# Patient Record
Sex: Female | Born: 1955 | Race: White | Hispanic: No | Marital: Married | Smoking: Former smoker
Health system: Southern US, Community
[De-identification: ages and names within clinical notes are randomized; demographics above are authoritative.]

## PROBLEM LIST (undated history)

## (undated) DIAGNOSIS — T7840XA Allergy, unspecified, initial encounter: Secondary | ICD-10-CM

## (undated) DIAGNOSIS — G43909 Migraine, unspecified, not intractable, without status migrainosus: Secondary | ICD-10-CM

## (undated) DIAGNOSIS — R195 Other fecal abnormalities: Secondary | ICD-10-CM

## (undated) DIAGNOSIS — Z9889 Other specified postprocedural states: Secondary | ICD-10-CM

## (undated) DIAGNOSIS — R112 Nausea with vomiting, unspecified: Secondary | ICD-10-CM

## (undated) DIAGNOSIS — F419 Anxiety disorder, unspecified: Secondary | ICD-10-CM

## (undated) DIAGNOSIS — I10 Essential (primary) hypertension: Secondary | ICD-10-CM

## (undated) DIAGNOSIS — M519 Unspecified thoracic, thoracolumbar and lumbosacral intervertebral disc disorder: Secondary | ICD-10-CM

## (undated) DIAGNOSIS — K5900 Constipation, unspecified: Secondary | ICD-10-CM

## (undated) DIAGNOSIS — M1990 Unspecified osteoarthritis, unspecified site: Secondary | ICD-10-CM

## (undated) DIAGNOSIS — E785 Hyperlipidemia, unspecified: Secondary | ICD-10-CM

## (undated) HISTORY — PX: KNEE CARTILAGE SURGERY: SHX688

## (undated) HISTORY — PX: KNEE SURGERY: SHX244

## (undated) HISTORY — DX: Allergy, unspecified, initial encounter: T78.40XA

## (undated) HISTORY — DX: Essential (primary) hypertension: I10

## (undated) HISTORY — DX: Unspecified thoracic, thoracolumbar and lumbosacral intervertebral disc disorder: M51.9

## (undated) HISTORY — PX: CHOLECYSTECTOMY: SHX55

## (undated) HISTORY — DX: Hyperlipidemia, unspecified: E78.5

## (undated) HISTORY — DX: Unspecified osteoarthritis, unspecified site: M19.90

## (undated) HISTORY — DX: Constipation, unspecified: K59.00

## (undated) HISTORY — DX: Other fecal abnormalities: R19.5

## (undated) HISTORY — DX: Nausea with vomiting, unspecified: Z98.890

## (undated) HISTORY — PX: APPENDECTOMY: SHX54

## (undated) HISTORY — DX: Anxiety disorder, unspecified: F41.9

## (undated) HISTORY — DX: Other specified postprocedural states: R11.2

## (undated) HISTORY — PX: JOINT REPLACEMENT: SHX530

## (undated) HISTORY — DX: Migraine, unspecified, not intractable, without status migrainosus: G43.909

---

## 1977-09-15 HISTORY — PX: ABDOMINAL HYSTERECTOMY: SHX81

## 1999-01-08 ENCOUNTER — Other Ambulatory Visit: Admission: RE | Admit: 1999-01-08 | Discharge: 1999-01-08 | Payer: Self-pay | Admitting: Family Medicine

## 1999-10-29 ENCOUNTER — Emergency Department (HOSPITAL_COMMUNITY): Admission: EM | Admit: 1999-10-29 | Discharge: 1999-10-29 | Payer: Self-pay | Admitting: Emergency Medicine

## 2000-09-13 ENCOUNTER — Emergency Department (HOSPITAL_COMMUNITY): Admission: EM | Admit: 2000-09-13 | Discharge: 2000-09-13 | Payer: Self-pay | Admitting: Emergency Medicine

## 2003-02-08 ENCOUNTER — Emergency Department (HOSPITAL_COMMUNITY): Admission: EM | Admit: 2003-02-08 | Discharge: 2003-02-08 | Payer: Self-pay | Admitting: Emergency Medicine

## 2010-12-02 ENCOUNTER — Emergency Department (HOSPITAL_COMMUNITY): Payer: No Typology Code available for payment source

## 2010-12-02 ENCOUNTER — Emergency Department (HOSPITAL_COMMUNITY)
Admission: EM | Admit: 2010-12-02 | Discharge: 2010-12-02 | Disposition: A | Discharge status: Home / Self Care | Payer: No Typology Code available for payment source | Attending: Emergency Medicine | Admitting: Emergency Medicine

## 2010-12-02 DIAGNOSIS — S8000XA Contusion of unspecified knee, initial encounter: Secondary | ICD-10-CM | POA: Insufficient documentation

## 2010-12-02 DIAGNOSIS — R51 Headache: Secondary | ICD-10-CM | POA: Insufficient documentation

## 2010-12-02 DIAGNOSIS — Y92410 Unspecified street and highway as the place of occurrence of the external cause: Secondary | ICD-10-CM | POA: Insufficient documentation

## 2010-12-02 DIAGNOSIS — IMO0002 Reserved for concepts with insufficient information to code with codable children: Secondary | ICD-10-CM | POA: Insufficient documentation

## 2010-12-02 DIAGNOSIS — S9030XA Contusion of unspecified foot, initial encounter: Secondary | ICD-10-CM | POA: Insufficient documentation

## 2012-03-14 ENCOUNTER — Encounter: Payer: Self-pay | Admitting: Internal Medicine

## 2012-03-14 ENCOUNTER — Ambulatory Visit (INDEPENDENT_AMBULATORY_CARE_PROVIDER_SITE_OTHER): Payer: BLUE CROSS/BLUE SHIELD | Admitting: Internal Medicine

## 2012-03-14 VITALS — BP 126/85 | HR 92 | Temp 97.9°F | Resp 18 | Ht 62.0 in | Wt 140.6 lb

## 2012-03-14 DIAGNOSIS — R1906 Epigastric swelling, mass or lump: Secondary | ICD-10-CM

## 2012-03-14 DIAGNOSIS — R1013 Epigastric pain: Secondary | ICD-10-CM

## 2012-03-14 DIAGNOSIS — R110 Nausea: Secondary | ICD-10-CM

## 2012-03-14 DIAGNOSIS — R0789 Other chest pain: Secondary | ICD-10-CM

## 2012-03-14 LAB — POCT UA - MICROSCOPIC ONLY
Bacteria, U Microscopic: NEGATIVE
Casts, Ur, LPF, POC: NEGATIVE
Crystals, Ur, HPF, POC: NEGATIVE
Mucus, UA: NEGATIVE
RBC, urine, microscopic: NEGATIVE
Yeast, UA: NEGATIVE

## 2012-03-14 LAB — POCT CBC
Granulocyte percent: 78.1 %G (ref 37–80)
HCT, POC: 45.9 % (ref 37.7–47.9)
Hemoglobin: 14.5 g/dL (ref 12.2–16.2)
Lymph, poc: 0.7 (ref 0.6–3.4)
MCH, POC: 30 pg (ref 27–31.2)
MCHC: 31.6 g/dL — AB (ref 31.8–35.4)
MCV: 95 fL (ref 80–97)
MID (cbc): 0.3 (ref 0–0.9)
MPV: 10 fL (ref 0–99.8)
POC Granulocyte: 3.4 (ref 2–6.9)
POC LYMPH PERCENT: 16 %L (ref 10–50)
POC MID %: 5.9 %M (ref 0–12)
Platelet Count, POC: 173 10*3/uL (ref 142–424)
RBC: 4.83 M/uL (ref 4.04–5.48)
RDW, POC: 13.1 %
WBC: 4.3 10*3/uL — AB (ref 4.6–10.2)

## 2012-03-14 LAB — COMPREHENSIVE METABOLIC PANEL
ALT: 25 U/L (ref 0–35)
AST: 27 U/L (ref 0–37)
Albumin: 4.2 g/dL (ref 3.5–5.2)
Alkaline Phosphatase: 75 U/L (ref 39–117)
BUN: 15 mg/dL (ref 6–23)
CO2: 30 mEq/L (ref 19–32)
Calcium: 9.3 mg/dL (ref 8.4–10.5)
Chloride: 102 mEq/L (ref 96–112)
Creat: 0.71 mg/dL (ref 0.50–1.10)
Glucose, Bld: 89 mg/dL (ref 70–99)
Potassium: 5.2 mEq/L (ref 3.5–5.3)
Sodium: 141 mEq/L (ref 135–145)
Total Bilirubin: 0.8 mg/dL (ref 0.3–1.2)
Total Protein: 6.5 g/dL (ref 6.0–8.3)

## 2012-03-14 MED ORDER — ONDANSETRON 8 MG PO TBDP: 8.0000 mg | ORAL_TABLET | Freq: Three times a day (TID) | ORAL | Status: AC | PRN

## 2012-03-14 MED ORDER — SUCRALFATE 1 GM/10ML PO SUSP: 1.0000 g | Freq: Four times a day (QID) | ORAL | Status: DC

## 2012-03-14 MED ORDER — ONDANSETRON 4 MG PO TBDP
8.0000 mg | ORAL_TABLET | Freq: Once | ORAL | Status: AC
  Administered 2012-03-14: 8 mg via ORAL

## 2012-03-14 MED ORDER — PROMETHAZINE HCL 25 MG PO TABS: 25.0000 mg | ORAL_TABLET | Freq: Three times a day (TID) | ORAL | Status: DC | PRN

## 2012-03-14 NOTE — Patient Instructions (Addendum)
Return to clinic if not better or to Emergency room if worseGastritis Gastritis is an inflammation (the body's way of reacting to injury and/or infection) of the stomach. It is often caused by viral or bacterial (germ) infections. It can also be caused by chemicals (including alcohol) and medications. This illness may be associated with generalized malaise (feeling tired, not well), cramps, and fever. The illness may last 2 to 7 days. If symptoms of gastritis continue, gastroscopy (looking into the stomach with a telescope-like instrument), biopsy (taking tissue samples), and/or blood tests may be necessary to determine the cause. Antibiotics will not affect the illness unless there is a bacterial infection present. One common bacterial cause of gastritis is an organism known as H. Pylori. This can be treated with antibiotics. Other forms of gastritis are caused by too much acid in the stomach. They can be treated with medications such as H2 blockers and antacids. Home treatment is usually all that is needed. Young children will quickly become dehydrated (loss of body fluids) if vomiting and diarrhea are both present. Medications may be given to control nausea. Medications are usually not given for diarrhea unless especially bothersome. Some medications slow the removal of the virus from the gastrointestinal tract. This slows down the healing process. HOME CARE INSTRUCTIONS Home care instructions for nausea and vomiting:  For adults: drink small amounts of fluids often. Drink at least 2 quarts a day. Take sips frequently. Do not drink large amounts of fluid at one time. This may worsen the nausea.   Only take over-the-counter or prescription medicines for pain, discomfort, or fever as directed by your caregiver.   Drink clear liquids only. Those are anything you can see through such as water, broth, or soft drinks.   Once you are keeping clear liquids down, you may start full liquids, soups, juices, and  ice cream or sherbet. Slowly add bland (plain, not spicy) foods to your diet.  Home care instructions for diarrhea:  Diarrhea can be caused by bacterial infections or a virus. Your condition should improve with time, rest, fluids, and/or anti-diarrheal medication.   Until your diarrhea is under control, you should drink clear liquids often in small amounts. Clear liquids include: water, broth, jell-o water and weak tea.  Avoid:  Milk.   Fruits.   Tobacco.   Alcohol.   Extremely hot or cold fluids.   Too much intake of anything at one time.  When your diarrhea stops you may add the following foods, which help the stool to become more formed:  Rice.   Bananas.   Apples without skin.   Dry toast.  Once these foods are tolerated you may add low-fat yogurt and low-fat cottage cheese. They will help to restore the normal bacterial balance in your bowel. Wash your hands well to avoid spreading bacteria (germ) or virus. SEEK IMMEDIATE MEDICAL CARE IF:   You are unable to keep fluids down.   Vomiting or diarrhea become persistent (constant).   Abdominal pain develops, increases, or localizes. (Right sided pain can be appendicitis. Left sided pain in adults can be diverticulitis.)   You develop a fever (an oral temperature above 102 F (38.9 C)).   Diarrhea becomes excessive or contains blood or mucus.   You have excessive weakness, dizziness, fainting or extreme thirst.   You are not improving or you are getting worse.   You have any other questions or concerns.  Document Released: 08/26/2001 Document Revised: 08/21/2011 Document Reviewed: 09/01/2005 [employer] Patient Information 2012  [employer].

## 2012-03-14 NOTE — Progress Notes (Signed)
  Subjective:    Patient ID: Angela Graves, female    DOB: 1956/08/18, 56 y.o.   MRN: [id number]  HPI Patient presents with onset of chest tightness with nausea and epigastric pain. Evaluated urgently. EKG and  Labs ordered. Patient reports heavy meal of stroganoff last night awoke this am with symptoms. Pt is s/p cholecystectomy.    Review of Systems     Objective:   Physical Exam        Assessment & Plan:  EKG normal. Patient given 8mg  Zofran for nausea without relief of nausea in office. Discharged with carafate Rx and Phenergan for nausea

## 2012-03-14 NOTE — Progress Notes (Signed)
  Subjective:    Patient ID: Angela Graves, female    DOB: 1956-03-05, 56 y.o.   MRN: [id number]  HPIPresented emergently Awoke with epigastric pain and substernal chest pressure plus nausea 2 hrs ago Not better after eating /no vomiting No GU symptoms/no history of reflux/no fever/is status post cholecystectomy many years ago/ Healthy nonsmoker with no illnesses and no known medications No change in bowel movements    Review of SystemsNo fever chills or night sweats No palpitations, shortness of breath, peripheral edema No constipation or diarrhea No recent symptoms of early a.m. Gastric burning      Objective:   Physical ExamIn mild distress EKG within normal limits HEENT clear Heart regular without murmurs rubs or gallops/rate 65 Lungs clear Tender over the epigastric area to palpation without mass/node enlargement of liver or spleen and both are nontender/no other abdominal masses or tenderness Bowel sounds are quiet   Results for orders placed in visit on 03/14/12  POCT CBC      Component Value Range   WBC 4.3 (*) 4.6 - 10.2 K/uL   Lymph, poc 0.7  0.6 - 3.4   POC LYMPH PERCENT 16.0  10 - 50 %L   MID (cbc) 0.3  0 - 0.9   POC MID % 5.9  0 - 12 %M   POC Granulocyte 3.4  2 - 6.9   Granulocyte percent 78.1  37 - 80 %G   RBC 4.83  4.04 - 5.48 M/uL   Hemoglobin 14.5  12.2 - 16.2 g/dL   HCT, POC 40.9  81.1 - 47.9 %   MCV 95.0  80 - 97 fL   MCH, POC 30.0  27 - 31.2 pg   MCHC 31.6 (*) 31.8 - 35.4 g/dL   RDW, POC 91.4     Platelet Count, POC 173  142 - 424 K/uL   MPV 10.0  0 - 99.8 fL  POCT UA - MICROSCOPIC ONLY      Component Value Range   WBC, Ur, HPF, POC 0-1     RBC, urine, microscopic neg     Bacteria, U Microscopic neg     Mucus, UA neg     Epithelial cells, urine per micros 0-1     Crystals, Ur, HPF, POC neg     Casts, Ur, LPF, POC neg     Yeast, UA neg    Has had no response from Zofran at 30 minutes EKG normal. Patient given 8mg  Zofran for nausea without  relief of nausea in office. Discharged with carafate Rx and Phenergan for nausea Assessment & Plan:  Problem #1 epigastric pain Problem #2 substernal pressure  Differential diagnosis includes an episode of reflux, a foodborne infection, a stone in the common duct Carafate 10 cc 4 times a day for 3 days/Phenergan if needed for nausea/Tylenol if needed for pain Recheck immediately if worse To  68 ER if worse tonight for scanning

## 2012-08-30 ENCOUNTER — Ambulatory Visit: Payer: BLUE CROSS/BLUE SHIELD | Admitting: Family Medicine

## 2012-08-30 VITALS — BP 133/85 | HR 80 | Temp 98.2°F | Resp 17 | Ht 62.5 in | Wt 142.0 lb

## 2012-08-30 DIAGNOSIS — R5383 Other fatigue: Secondary | ICD-10-CM

## 2012-08-30 DIAGNOSIS — R238 Other skin changes: Secondary | ICD-10-CM

## 2012-08-30 DIAGNOSIS — R233 Spontaneous ecchymoses: Secondary | ICD-10-CM

## 2012-08-30 DIAGNOSIS — R5381 Other malaise: Secondary | ICD-10-CM

## 2012-08-30 LAB — VITAMIN B12: Vitamin B-12: 442 pg/mL (ref 211–911)

## 2012-08-30 LAB — POCT CBC
Granulocyte percent: 50.1 %G (ref 37–80)
HCT, POC: 47.8 % (ref 37.7–47.9)
Hemoglobin: 15 g/dL (ref 12.2–16.2)
Lymph, poc: 2 (ref 0.6–3.4)
MCH, POC: 29.6 pg (ref 27–31.2)
MCHC: 31.4 g/dL — AB (ref 31.8–35.4)
MCV: 94.3 fL (ref 80–97)
MID (cbc): 0.3 (ref 0–0.9)
MPV: 11.1 fL (ref 0–99.8)
POC Granulocyte: 2.3 (ref 2–6.9)
POC LYMPH PERCENT: 43.9 %L (ref 10–50)
POC MID %: 6.3 %M (ref 0–12)
Platelet Count, POC: 225 10*3/uL (ref 142–424)
RBC: 5.07 M/uL (ref 4.04–5.48)
RDW, POC: 13.4 %
WBC: 4.6 10*3/uL (ref 4.6–10.2)

## 2012-08-30 LAB — PROTIME-INR
INR: 0.89 (ref <1.50)
Prothrombin Time: 12.1 seconds (ref 11.6–15.2)

## 2012-08-30 LAB — TSH: TSH: 1.769 u[IU]/mL (ref 0.350–4.500)

## 2012-08-30 LAB — COMPREHENSIVE METABOLIC PANEL
ALT: 24 U/L (ref 0–35)
AST: 21 U/L (ref 0–37)
Albumin: 5 g/dL (ref 3.5–5.2)
Alkaline Phosphatase: 84 U/L (ref 39–117)
BUN: 17 mg/dL (ref 6–23)
CO2: 29 mEq/L (ref 19–32)
Calcium: 9.8 mg/dL (ref 8.4–10.5)
Chloride: 100 mEq/L (ref 96–112)
Creat: 0.69 mg/dL (ref 0.50–1.10)
Glucose, Bld: 97 mg/dL (ref 70–99)
Potassium: 4.8 mEq/L (ref 3.5–5.3)
Sodium: 140 mEq/L (ref 135–145)
Total Bilirubin: 0.6 mg/dL (ref 0.3–1.2)
Total Protein: 7.1 g/dL (ref 6.0–8.3)

## 2012-08-30 LAB — POCT SEDIMENTATION RATE: POCT SED RATE: 18 mm/hr (ref 0–22)

## 2012-08-30 LAB — APTT: aPTT: 26 seconds (ref 24–37)

## 2012-08-30 LAB — CK: Total CK: 59 U/L (ref 7–177)
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
## 2012-08-31 LAB — MAGNESIUM: Magnesium: 2.2 mg/dL (ref 1.5–2.5)

## 2012-08-31 LAB — C-REACTIVE PROTEIN: CRP: <0.5 mg/dL (ref <0.60)

## 2012-10-10 ENCOUNTER — Ambulatory Visit (INDEPENDENT_AMBULATORY_CARE_PROVIDER_SITE_OTHER): Payer: BC Managed Care – PPO | Admitting: Emergency Medicine

## 2012-10-10 VITALS — BP 134/82 | HR 81 | Temp 97.9°F | Resp 16 | Ht 62.38 in | Wt 144.6 lb

## 2012-10-10 DIAGNOSIS — H6980 Other specified disorders of Eustachian tube, unspecified ear: Secondary | ICD-10-CM

## 2012-10-10 MED ORDER — MOMETASONE FUROATE 50 MCG/ACT NA SUSP: 2.0000 | Freq: Every day | NASAL | Status: DC

## 2012-10-10 MED ORDER — PSEUDOEPHEDRINE-GUAIFENESIN ER 60-600 MG PO TB12: 1.0000 | ORAL_TABLET | Freq: Two times a day (BID) | ORAL | Status: DC

## 2012-10-10 NOTE — Progress Notes (Signed)
[facility] [address] [place] [address] [phone]  Date:  10/10/2012   Name:  Angela Graves   DOB:  10/16/1955   MRN:  [id number]  PCP:  No primary provider on file.    Chief Complaint: Otalgia   History of Present Illness:  Angela Graves is a 57 y.o. very pleasant female patient who presents with the following:  Sudden onset of pain in left ear.  No antecedent illness. No fever or chills.  No nausea or vomiting or cough. Has some chronic seasonal allergic rhinitis  There is no problem list on file for this patient.   Past Medical History  Diagnosis Date  . Migraine     Past Surgical History  Procedure Date  . Joint replacement   . Cholecystectomy   . Abdominal hysterectomy     History  Substance Use Topics  . Smoking status: Former Games developer  . Smokeless tobacco: Not on file  . Alcohol Use: 3.0 oz/week    5 Glasses of wine per week     Comment: socially    No family history on file.  Allergies  Allergen Reactions  . Codeine Itching  . Morphine And Related Other (See Comments)    Hyperactive    Medication list has been reviewed and updated.  Current Outpatient Prescriptions on File Prior to Visit  Medication Sig Dispense Refill  . promethazine (PHENERGAN) 25 MG tablet Take 1 tablet (25 mg total) by mouth every 8 (eight) hours as needed for nausea.  6 tablet  0  . sucralfate (CARAFATE) 1 GM/10ML suspension Take 10 mLs (1 g total) by mouth 4 (four) times daily.  420 mL  0    Review of Systems:  As per HPI, otherwise negative.    Physical Examination: Filed Vitals:   10/10/12 0853  BP: 134/82  Pulse: 81  Temp: 97.9 F (36.6 C)  Resp: 16   Filed Vitals:   10/10/12 0853  Height: 5' 2.38" (1.584 m)  Weight: 144 lb 9.6 oz (65.59 kg)   Body mass index is 26.13 kg/(m^2). Ideal Body Weight: Weight in (lb) to have BMI = 25: 138.1   GEN: WDWN, NAD, Non-toxic, A & O x 3 HEENT: Atraumatic, Normocephalic. Neck supple.  No masses, No LAD. Ears and Nose: No external deformity.  Left TM retracted CV: RRR, No M/G/R. No JVD. No thrill. No extra heart sounds. PULM: CTA B, no wheezes, crackles, rhonchi. No retractions. No resp. distress. No accessory muscle use. ABD: S, NT, ND, +BS. No rebound. No HSM. EXTR: No c/c/e NEURO Normal gait.  PSYCH: Normally interactive. Conversant. Not depressed or anxious appearing.  Calm demeanor.    Assessment and Plan: Eustachian tube dysfunction mucinex d nasonex Follow up as needed  Carmelina Dane, MD

## 2012-10-10 NOTE — Patient Instructions (Signed)
Barotitis Media Barotitis media is soreness (inflammation) of the area behind the eardrum (middle ear). This occurs when the auditory tube (Eustachian tube) leading from the back of the throat to the eardrum is blocked. When it is blocked air cannot move in and out of the middle ear to equalize pressure changes. These pressure changes come from changes in altitude when:  Flying.  Driving in the mountains.  Diving. Problems are more likely to occur with pressure changes during times when you are congested as from:  Hay fever.  Upper respiratory infection.  A cold. Damage or hearing loss (barotrauma) caused by this may be permanent. HOME CARE INSTRUCTIONS   Use medicines as recommended by your caregiver. Over the counter medicines will help unblock the canal and can help during times of air travel.  Do not put anything into your ears to clean or unplug them. Eardrops will not be helpful.  Do not swim, dive, or fly until your caregiver says it is all right to do so. If these activities are necessary, chewing gum with frequent swallowing may help. It is also helpful to hold your nose and gently blow to pop your ears for equalizing pressure changes. This forces air into the Eustachian tube.  For little ones with problems, give your baby a bottle of water or juice during periods when pressure changes would be anticipated such as during take offs and landings associated with air travel.  Only take over-the-counter or prescription medicines for pain, discomfort, or fever as directed by your caregiver.  A decongestant may be helpful in de-congesting the middle ear and make pressure equalization easier. This can be even more effective if the drops (spray) are delivered with the head lying over the edge of a bed with the head tilted toward the ear on the affected side.  If your caregiver has given you a follow-up appointment, it is very important to keep that appointment. Not keeping the  appointment could result in a chronic or permanent injury, pain, hearing loss and disability. If there is any problem keeping the appointment, you must call back to this facility for assistance. SEEK IMMEDIATE MEDICAL CARE IF:   You develop a severe headache, dizziness, severe ear pain, or bloody or pus-like drainage from your ears.  An oral temperature above 102 F (38.9 C) develops.  Your problems do not improve or become worse. MAKE SURE YOU:   Understand these instructions.  Will watch your condition.  Will get help right away if you are not doing well or get worse. Document Released: 08/29/2000 Document Revised: 11/24/2011 Document Reviewed: 04/06/2008 [facility] Patient Information 2013 [place], [place].

## 2012-10-30 ENCOUNTER — Other Ambulatory Visit: Payer: Self-pay

## 2013-02-11 ENCOUNTER — Ambulatory Visit: Payer: BC Managed Care – PPO | Admitting: Physician Assistant

## 2013-02-11 VITALS — BP 124/86 | HR 97 | Temp 98.1°F | Resp 18 | Ht 62.0 in | Wt 144.0 lb

## 2013-02-11 DIAGNOSIS — J209 Acute bronchitis, unspecified: Secondary | ICD-10-CM

## 2013-02-11 DIAGNOSIS — J069 Acute upper respiratory infection, unspecified: Secondary | ICD-10-CM

## 2013-02-11 MED ORDER — PROMETHAZINE-DM 6.25-15 MG/5ML PO SYRP: 5.0000 mL | ORAL_SOLUTION | Freq: Every evening | ORAL | Status: DC | PRN

## 2013-02-11 MED ORDER — AZITHROMYCIN 250 MG PO TABS: ORAL_TABLET | ORAL | Status: DC

## 2013-02-11 MED ORDER — IPRATROPIUM BROMIDE 0.03 % NA SOLN: 2.0000 | Freq: Two times a day (BID) | NASAL | Status: DC

## 2013-02-11 MED ORDER — BENZONATATE 100 MG PO CAPS: 100.0000 mg | ORAL_CAPSULE | Freq: Three times a day (TID) | ORAL | Status: DC | PRN

## 2013-02-11 NOTE — Patient Instructions (Addendum)
Begin taking the azithromycin as directed.  Be sure to finish the full course.  Tessalon Perles every 8 hours as needed for cough.  Phenergan DM at bedtime for cough.  Plenty of fluids and rest.  Continue Zyrtec daily.  Atrovent nasal spray twice daily to help with nasal congestion and post-nasal drainage.  Please let us know if symptoms are worsening or not improving.   Bronchitis Bronchitis is the body's way of reacting to injury and/or infection (inflammation) of the bronchi. Bronchi are the air tubes that extend from the windpipe into the lungs. If the inflammation becomes severe, it may cause shortness of breath. CAUSES  Inflammation may be caused by:  A virus.  Germs (bacteria).  Dust.  Allergens.  Pollutants and many other irritants. The cells lining the bronchial tree are covered with tiny hairs (cilia). These constantly beat upward, away from the lungs, toward the mouth. This keeps the lungs free of pollutants. When these cells become too irritated and are unable to do their job, mucus begins to develop. This causes the characteristic cough of bronchitis. The cough clears the lungs when the cilia are unable to do their job. Without either of these protective mechanisms, the mucus would settle in the lungs. Then you would develop pneumonia. Smoking is a common cause of bronchitis and can contribute to pneumonia. Stopping this habit is the single most important thing you can do to help yourself. TREATMENT   Your caregiver may prescribe an antibiotic if the cough is caused by bacteria. Also, medicines that open up your airways make it easier to breathe. Your caregiver may also recommend or prescribe an expectorant. It will loosen the mucus to be coughed up. Only take over-the-counter or prescription medicines for pain, discomfort, or fever as directed by your caregiver.  Removing whatever causes the problem (smoking, for example) is critical to preventing the problem from getting  worse.  Cough suppressants may be prescribed for relief of cough symptoms.  Inhaled medicines may be prescribed to help with symptoms now and to help prevent problems from returning.  For those with recurrent (chronic) bronchitis, there may be a need for steroid medicines. SEEK IMMEDIATE MEDICAL CARE IF:   During treatment, you develop more pus-like mucus (purulent sputum).  You have a fever.  Your baby is older than 3 months with a rectal temperature of 102 F (38.9 C) or higher.  Your baby is 64 months old or younger with a rectal temperature of 100.4 F (38 C) or higher.  You become progressively more ill.  You have increased difficulty breathing, wheezing, or shortness of breath. It is necessary to seek immediate medical care if you are elderly or sick from any other disease. MAKE SURE YOU:   Understand these instructions.  Will watch your condition.  Will get help right away if you are not doing well or get worse. Document Released: 09/01/2005 Document Revised: 11/24/2011 Document Reviewed: 07/11/2008 [facility] Patient Information 2014 [place], [place].

## 2013-02-11 NOTE — Progress Notes (Signed)
  Subjective:    Patient ID: Angela Graves, female    DOB: [birth year], 57 y.o.   MRN: [id number]  HPI   Angela Graves is a 57 yr female here with concern for illness.  State she was ill with a cough earlier this month, began to feel better, then the cough has recurred.  Symptoms started 5 days ago.  Cough is minimally productive.  Some nasal congestion and PND as well.  No history of asthma or COPD.  No fever or chills.  No ST or ear pain.  No abd pain or GI symptoms.  Angela Graves has tried Zyrtec and cough drops.  Cough seems to be worse at night, keeps her awake.    Unfortunately, Angela Graves's grandson has run away from home - Angela Graves is very distraught about this.     Review of Systems  Constitutional: Negative for fever and chills.  HENT: Positive for congestion and postnasal drip. Negative for sore throat.   Respiratory: Positive for cough. Negative for shortness of breath and wheezing.   Cardiovascular: Negative.   Gastrointestinal: Negative.   Musculoskeletal: Negative.   Skin: Negative.   Neurological: Negative.        Objective:   Physical Exam  Vitals reviewed. Constitutional: She is oriented to person, place, and time. She appears well-developed and well-nourished. No distress.  HENT:  Head: Normocephalic and atraumatic.  Eyes: Conjunctivae are normal. No scleral icterus.  Neck: Neck supple.  Cardiovascular: Normal rate, regular rhythm and normal heart sounds.   Pulmonary/Chest: Effort normal and breath sounds normal. She has no decreased breath sounds. She has no wheezes. She has no rhonchi. She has no rales.  Lymphadenopathy:    She has no cervical adenopathy.  Neurological: She is alert and oriented to person, place, and time.  Skin: Skin is warm and dry.  Psychiatric: She has a normal mood and affect. Her behavior is normal.          Assessment & Plan:  Acute bronchitis - Plan: azithromycin (ZITHROMAX) 250 MG tablet, promethazine-dextromethorphan (PROMETHAZINE-DM) 6.25-15 MG/5ML  syrup, benzonatate (TESSALON) 100 MG capsule  Acute upper respiratory infections of unspecified site - Plan: ipratropium (ATROVENT) 0.03 % nasal spray   Angela Graves is a 57 yr old female here with cough/URI.  Exam is reassuring, she is afebrile.  Given double-sickening, I think it is reasonable to treat with antibiotics.  Will start Zpak.  Tessalon and Atrovent for symptoms.  Continue Zyrtec daily.  Angela Graves is intolerant to codeine and hydrocodone, so will try phenergan DM for nighttime cough.  Push fluids, rest.  Discussed RTC precautions.

## 2013-07-21 ENCOUNTER — Other Ambulatory Visit: Payer: Self-pay

## 2014-09-08 ENCOUNTER — Emergency Department (HOSPITAL_COMMUNITY)
Admission: EM | Admit: 2014-09-08 | Discharge: 2014-09-08 | Disposition: A | Discharge status: Home / Self Care | Payer: BC Managed Care – PPO | Attending: Emergency Medicine | Admitting: Emergency Medicine

## 2014-09-08 ENCOUNTER — Emergency Department (HOSPITAL_COMMUNITY): Payer: BC Managed Care – PPO

## 2014-09-08 ENCOUNTER — Encounter (HOSPITAL_COMMUNITY): Payer: Self-pay | Admitting: Emergency Medicine

## 2014-09-08 DIAGNOSIS — Z8679 Personal history of other diseases of the circulatory system: Secondary | ICD-10-CM | POA: Insufficient documentation

## 2014-09-08 DIAGNOSIS — R1013 Epigastric pain: Secondary | ICD-10-CM

## 2014-09-08 DIAGNOSIS — Y998 Other external cause status: Secondary | ICD-10-CM | POA: Insufficient documentation

## 2014-09-08 DIAGNOSIS — Z79899 Other long term (current) drug therapy: Secondary | ICD-10-CM | POA: Diagnosis not present

## 2014-09-08 DIAGNOSIS — Y92410 Unspecified street and highway as the place of occurrence of the external cause: Secondary | ICD-10-CM | POA: Insufficient documentation

## 2014-09-08 DIAGNOSIS — S0093XA Contusion of unspecified part of head, initial encounter: Secondary | ICD-10-CM

## 2014-09-08 DIAGNOSIS — R51 Headache: Secondary | ICD-10-CM

## 2014-09-08 DIAGNOSIS — Z87891 Personal history of nicotine dependence: Secondary | ICD-10-CM | POA: Insufficient documentation

## 2014-09-08 DIAGNOSIS — M542 Cervicalgia: Secondary | ICD-10-CM

## 2014-09-08 DIAGNOSIS — M79642 Pain in left hand: Secondary | ICD-10-CM

## 2014-09-08 DIAGNOSIS — S0990XA Unspecified injury of head, initial encounter: Secondary | ICD-10-CM | POA: Diagnosis present

## 2014-09-08 DIAGNOSIS — S0083XA Contusion of other part of head, initial encounter: Secondary | ICD-10-CM | POA: Diagnosis not present

## 2014-09-08 DIAGNOSIS — Y9389 Activity, other specified: Secondary | ICD-10-CM | POA: Diagnosis not present

## 2014-09-08 DIAGNOSIS — S199XXA Unspecified injury of neck, initial encounter: Secondary | ICD-10-CM | POA: Insufficient documentation

## 2014-09-08 DIAGNOSIS — S60222A Contusion of left hand, initial encounter: Secondary | ICD-10-CM | POA: Diagnosis not present

## 2014-09-08 DIAGNOSIS — R519 Headache, unspecified: Secondary | ICD-10-CM

## 2014-09-08 DIAGNOSIS — R110 Nausea: Secondary | ICD-10-CM

## 2014-09-08 MED ORDER — IBUPROFEN 600 MG PO TABS: 600.0000 mg | ORAL_TABLET | Freq: Four times a day (QID) | ORAL | Status: DC | PRN

## 2014-09-08 MED ORDER — HYDROCODONE-ACETAMINOPHEN 5-325 MG PO TABS: 1.0000 | ORAL_TABLET | Freq: Four times a day (QID) | ORAL | Status: DC | PRN

## 2014-09-08 MED ORDER — LORAZEPAM 1 MG PO TABS
1.0000 mg | ORAL_TABLET | Freq: Once | ORAL | Status: AC
  Administered 2014-09-08: 1 mg via ORAL
  Filled 2014-09-08: qty 1

## 2014-09-08 MED ORDER — PROMETHAZINE HCL 25 MG PO TABS: 25.0000 mg | ORAL_TABLET | Freq: Three times a day (TID) | ORAL | Status: DC | PRN
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
## 2015-02-16 ENCOUNTER — Ambulatory Visit (INDEPENDENT_AMBULATORY_CARE_PROVIDER_SITE_OTHER): Payer: BLUE CROSS/BLUE SHIELD | Admitting: Family Medicine

## 2015-02-16 ENCOUNTER — Ambulatory Visit (INDEPENDENT_AMBULATORY_CARE_PROVIDER_SITE_OTHER): Payer: BLUE CROSS/BLUE SHIELD

## 2015-02-16 ENCOUNTER — Encounter: Payer: Self-pay | Admitting: Family Medicine

## 2015-02-16 VITALS — BP 150/92 | HR 86 | Temp 98.1°F | Resp 16 | Ht 62.5 in | Wt 152.0 lb

## 2015-02-16 DIAGNOSIS — M47817 Spondylosis without myelopathy or radiculopathy, lumbosacral region: Secondary | ICD-10-CM | POA: Diagnosis not present

## 2015-02-16 DIAGNOSIS — Z1211 Encounter for screening for malignant neoplasm of colon: Secondary | ICD-10-CM | POA: Diagnosis not present

## 2015-02-16 DIAGNOSIS — Z0000 Encounter for general adult medical examination without abnormal findings: Secondary | ICD-10-CM

## 2015-02-16 DIAGNOSIS — Z1383 Encounter for screening for respiratory disorder NEC: Secondary | ICD-10-CM

## 2015-02-16 DIAGNOSIS — M5137 Other intervertebral disc degeneration, lumbosacral region: Secondary | ICD-10-CM | POA: Diagnosis not present

## 2015-02-16 DIAGNOSIS — Z1239 Encounter for other screening for malignant neoplasm of breast: Secondary | ICD-10-CM

## 2015-02-16 DIAGNOSIS — M545 Low back pain, unspecified: Secondary | ICD-10-CM

## 2015-02-16 DIAGNOSIS — Z136 Encounter for screening for cardiovascular disorders: Secondary | ICD-10-CM | POA: Diagnosis not present

## 2015-02-16 DIAGNOSIS — R0602 Shortness of breath: Secondary | ICD-10-CM

## 2015-02-16 DIAGNOSIS — Z1389 Encounter for screening for other disorder: Secondary | ICD-10-CM | POA: Diagnosis not present

## 2015-02-16 DIAGNOSIS — R072 Precordial pain: Secondary | ICD-10-CM | POA: Diagnosis not present

## 2015-02-16 DIAGNOSIS — R030 Elevated blood-pressure reading, without diagnosis of hypertension: Secondary | ICD-10-CM | POA: Diagnosis not present

## 2015-02-16 DIAGNOSIS — IMO0001 Reserved for inherently not codable concepts without codable children: Secondary | ICD-10-CM

## 2015-02-16 DIAGNOSIS — M2550 Pain in unspecified joint: Secondary | ICD-10-CM

## 2015-02-16 DIAGNOSIS — Z720 Tobacco use: Secondary | ICD-10-CM

## 2015-02-16 LAB — CBC
HCT: 41.5 % (ref 36.0–46.0)
Hemoglobin: 14.4 g/dL (ref 12.0–15.0)
MCH: 30.5 pg (ref 26.0–34.0)
MCHC: 34.7 g/dL (ref 30.0–36.0)
MCV: 87.9 fL (ref 78.0–100.0)
MPV: 11.6 fL (ref 8.6–12.4)
Platelets: 200 10*3/uL (ref 150–400)
RBC: 4.72 MIL/uL (ref 3.87–5.11)
RDW: 12.8 % (ref 11.5–15.5)
WBC: 3.7 10*3/uL — ABNORMAL LOW (ref 4.0–10.5)

## 2015-02-16 LAB — POCT URINALYSIS DIPSTICK
Bilirubin, UA: NEGATIVE
Blood, UA: NEGATIVE
Glucose, UA: NEGATIVE
Ketones, UA: NEGATIVE
Leukocytes, UA: NEGATIVE
Nitrite, UA: NEGATIVE
Protein, UA: NEGATIVE
Spec Grav, UA: 1.015
Urobilinogen, UA: 0.2
pH, UA: 6

## 2015-02-16 MED ORDER — ALBUTEROL SULFATE HFA 108 (90 BASE) MCG/ACT IN AERS: 2.0000 | INHALATION_SPRAY | RESPIRATORY_TRACT | Status: DC | PRN

## 2015-02-16 MED ORDER — DIAZEPAM 5 MG PO TABS
5.0000 mg | ORAL_TABLET | Freq: Two times a day (BID) | ORAL | Status: DC | PRN
Start: 2015-02-16 — End: 2015-06-30
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
## 2015-02-16 NOTE — Patient Instructions (Addendum)
Blood pressure is much to high today but this is likely because it is physical day but please check your blood pressure at your pharmacy or if you (or a friend or family member) have a BP cuff that'd be great. If it is still >140/90, start low salt DASH diet and increase water.  Recommend recheck BP in office in 2-3 months. I recommend GI referral for your symptoms of esophageal spasm as well as for screening colonoscopy. Peppermint oil has been connected to improvement in chest pain and esophageal spasms. The clinical significance of these observations remains to be determined. In experience, high concentration of peppermint oil (eg, sublingual Altoids on an as needed basis) may provide relief for attacks of DES-related chest pain.  Try glucosamine-chondroiton in high doses (>1500 mg) to see if it helps with arthritis pain.  In addition tylenol arthritis is going to be the safest to use.  Try to keep active/exericising/mobile.   Managing Your High Blood Pressure Blood pressure is a measurement of how forceful your blood is pressing against the walls of the arteries. Arteries are muscular tubes within the circulatory system. Blood pressure does not stay the same. Blood pressure rises when you are active, excited, or nervous; and it lowers during sleep and relaxation. If the numbers measuring your blood pressure stay above normal most of the time, you are at risk for health problems. High blood pressure (hypertension) is a long-term (chronic) condition in which blood pressure is elevated. A blood pressure reading is recorded as two numbers, such as 120 over 80 (or 120/80). The first, higher number is called the systolic pressure. It is a measure of the pressure in your arteries as the heart beats. The second, lower number is called the diastolic pressure. It is a measure of the pressure in your arteries as the heart relaxes between beats.  Keeping your blood pressure in a normal range is important to your  overall health and prevention of health problems, such as heart disease and stroke. When your blood pressure is uncontrolled, your heart has to work harder than normal. High blood pressure is a very common condition in adults because blood pressure tends to rise with age. Men and women are equally likely to have hypertension but at different times in life. Before age 69, men are more likely to have hypertension. After 61 years of age, women are more likely to have it. Hypertension is especially common in African Americans. This condition often has no signs or symptoms. The cause of the condition is usually not known. Your caregiver can help you come up with a plan to keep your blood pressure in a normal, healthy range. BLOOD PRESSURE STAGES Blood pressure is classified into four stages: normal, prehypertension, stage 1, and stage 2. Your blood pressure reading will be used to determine what type of treatment, if any, is necessary. Appropriate treatment options are tied to these four stages:  Normal  Systolic pressure (mm Hg): below 120.  Diastolic pressure (mm Hg): below 80. Prehypertension  Systolic pressure (mm Hg): 120 to 139.  Diastolic pressure (mm Hg): 80 to 89. Stage1  Systolic pressure (mm Hg): 140 to 159.  Diastolic pressure (mm Hg): 90 to 99. Stage2  Systolic pressure (mm Hg): 160 or above.  Diastolic pressure (mm Hg): 100 or above. RISKS RELATED TO HIGH BLOOD PRESSURE Managing your blood pressure is an important responsibility. Uncontrolled high blood pressure can lead to:  A heart attack.  A stroke.  A weakened blood vessel (  aneurysm).  Heart failure.  Kidney damage.  Eye damage.  Metabolic syndrome.  Memory and concentration problems. HOW TO MANAGE YOUR BLOOD PRESSURE Blood pressure can be managed effectively with lifestyle changes and medicines (if needed). Your caregiver will help you come up with a plan to bring your blood pressure within a normal range.  Your plan should include the following: Education  Read all information provided by your caregivers about how to control blood pressure.  Educate yourself on the latest guidelines and treatment recommendations. New research is always being done to further define the risks and treatments for high blood pressure. Lifestylechanges  Control your weight.  Avoid smoking.  Stay physically active.  Reduce the amount of salt in your diet.  Reduce stress.  Control any chronic conditions, such as high cholesterol or diabetes.  Reduce your alcohol intake. Medicines  Several medicines (antihypertensive medicines) are available, if needed, to bring blood pressure within a normal range. Communication  Review all the medicines you take with your caregiver because there may be side effects or interactions.  Talk with your caregiver about your diet, exercise habits, and other lifestyle factors that may be contributing to high blood pressure.  See your caregiver regularly. Your caregiver can help you create and adjust your plan for managing high blood pressure. RECOMMENDATIONS FOR TREATMENT AND FOLLOW-UP  The following recommendations are based on current guidelines for managing high blood pressure in nonpregnant adults. Use these recommendations to identify the proper follow-up period or treatment option based on your blood pressure reading. You can discuss these options with your caregiver.  Systolic pressure of 381 to 829 or diastolic pressure of 80 to 89: Follow up with your caregiver as directed.  Systolic pressure of 937 to 169 or diastolic pressure of 90 to 100: Follow up with your caregiver within 2 months.  Systolic pressure above 678 or diastolic pressure above 938: Follow up with your caregiver within 1 month.  Systolic pressure above 101 or diastolic pressure above 751: Consider antihypertensive therapy; follow up with your caregiver within 1 week.  Systolic pressure above 025 or  diastolic pressure above 852: Begin antihypertensive therapy; follow up with your caregiver within 1 week. Document Released: 05/26/2012 Document Reviewed: 05/26/2012 [facility] Patient Information 2015 [place]. This information is not intended to replace advice given to you by your health care provider. Make sure you discuss any questions you have with your health care provider.  Esophageal Spasm Esophageal spasm is an uncoordinated contraction of the muscles of the esophagus (the tube which carries food from your mouth to your stomach). Normally, the muscles of the esophagus alternate between contraction and relaxation starting from the top of the esophagus and working down to the bottom. This moves the food from the mouth to the stomach. In esophageal spasm, all the muscles contract at once. This causes pain and fails to move the food along. As a result, you may have trouble swallowing.  Women are more likely than men to have esophageal spasm. The cause of the spasms is not known. Sometimes eating hot or cold foods triggers the condition and this may be due to an overly sensitive esophagus. This is not an infectious disease and cannot be passed to others. SYMPTOMS  Symptoms of esophageal spasm may include: chest pain, burning or pain with swallowing, and difficulty swallowing.  DIAGNOSIS  Esophageal spasm can be diagnosed by a test called manometry (pressure studies of the esophagus). In this test, a special tube is inserted down the  esophagus. The tube measures the muscle activity of the esophagus. Abnormal contractions mixed with normal movement helps confirm the diagnosis.  A person with a hypersensitive esophagus may be diagnosed by inflating a long balloon in the person's esophagus. If this causes the same symptoms, preventive methods may work. PREVENTION  Avoid hot or cold foods if that seems to be a trigger. PROGNOSIS  This condition does not go away, nor is treatment entirely  satisfactory. Patients need to be careful of what they eat. They need to continue on medication if a useful one is found. Fortunately, the condition does not get progressively worse as time passes. Esophageal spasm does not usually lead to more serious problems but sometimes the pain can be disabling. If a person becomes afraid to eat they may become malnourished and lose weight.  TREATMENT   A procedure in which instruments of increasing size are inserted through the esophagus to enlarge (dilate) it are used.  Medications that decrease acid-production of the stomach may be used such as proton-pump inhibitors or H2-blockers.  Medications of several types can be used to relax the muscles of the esophagus.  An individual with a hypersensitive esophagus sometimes improves with low doses of medications normally used for depression.  No treatment for esophageal spasm is effective for everyone. Often several approaches will be tried before one works. In many cases, the symptoms will improve, but will not go away completely.  For severe cases, relief is obtained two-thirds of the time by cutting the muscles along the entire length of the esophagus. This is a major surgical procedure.  Your symptoms are usually the best guide to how well the treatment for esophageal spasm works. SIDE EFFECTS OF TREATMENTS  Nitrates can cause headaches and low blood pressure.  Calcium channel blockers can cause:  Feeling sick to your stomach (nausea).  Constipation and other side effects.  Antidepressants can cause side effects that depend on the medication used. HOME CARE INSTRUCTIONS   Let your caregiver know if problems are getting worse, or if you get food stuck in your esophagus for longer than 1 hour or as directed and are unable to swallow liquid.  Take medications as directed and with permission of your caregiver. Ask about what to do if a medication seems to get stuck in your esophagus. Only take  over-the-counter or prescription medicines for pain, discomfort, or fever as directed by your caregiver.  Soft and liquid foods pass more easily than solid pieces. SEEK IMMEDIATE MEDICAL CARE IF:   You develop severe chest pain, especially if the pain is crushing or pressure-like and spreads to the arms, back, neck, or jaw, or if you have sweating, nausea, or shortness of breath. THIS COULD BE AN EMERGENCY. Do not wait to see if the pain will go away. Get medical help at once. Call 911 or 0 (operator). DO NOT drive yourself to the hospital.  Your chest pain gets worse and does not go away with rest.  You have an attack of chest pain lasting longer than usual despite rest and treatment with the medications your physician has prescribed.  You wake from sleep with chest pain or shortness of breath.  You feel dizzy or faint.  You have chest pain, not typical of your usual pain, caused by your esophagus for which you originally saw your caregiver. MAKE SURE YOU:   Understand these instructions.  Will watch your condition.  Will get help right away if you are not doing well or get  worse. Document Released: 11/22/2002 Document Revised: 11/24/2011 Document Reviewed: 11/25/2013 [facility] Patient Information 2015 [place], [place]. This information is not intended to replace advice given to you by your health care provider. Make sure you discuss any questions you have with your health care provider.  Health Recommendations for Postmenopausal Women Respected and ongoing research has looked at the most common causes of death, disability, and poor quality of life in postmenopausal women. The causes include heart disease, diseases of blood vessels, diabetes, depression, cancer, and bone loss (osteoporosis). Many things can be done to help lower the chances of developing these and other common problems. CARDIOVASCULAR DISEASE Heart Disease: A heart attack is a medical emergency. Know the signs and  symptoms of a heart attack. Below are things women can do to reduce their risk for heart disease.   Do not smoke. If you smoke, quit.  Aim for a healthy weight. Being overweight causes many preventable deaths. Eat a healthy and balanced diet and drink an adequate amount of liquids.  Get moving. Make a commitment to be more physically active. Aim for 30 minutes of activity on most, if not all days of the week.  Eat for heart health. Choose a diet that is low in saturated fat and cholesterol and eliminate trans fat. Include whole grains, vegetables, and fruits. Read and understand the labels on food containers before buying.  Know your numbers. Ask your caregiver to check your blood pressure, cholesterol (total, HDL, LDL, triglycerides) and blood glucose. Work with your caregiver on improving your entire clinical picture.  High blood pressure. Limit or stop your table salt intake (try salt substitute and food seasonings). Avoid salty foods and drinks. Read labels on food containers before buying. Eating well and exercising can help control high blood pressure. STROKE  Stroke is a medical emergency. Stroke may be the result of a blood clot in a blood vessel in the brain or by a brain hemorrhage (bleeding). Know the signs and symptoms of a stroke. To lower the risk of developing a stroke:  Avoid fatty foods.  Quit smoking.  Control your diabetes, blood pressure, and irregular heart rate. THROMBOPHLEBITIS (BLOOD CLOT) OF THE LEG  Becoming overweight and leading a stationary lifestyle may also contribute to developing blood clots. Controlling your diet and exercising will help lower the risk of developing blood clots. CANCER SCREENING  Breast Cancer: Take steps to reduce your risk of breast cancer.  You should practice "breast self-awareness." This means understanding the normal appearance and feel of your breasts and should include breast self-examination. Any changes detected, no matter how  small, should be reported to your caregiver.  After age 23, you should have a clinical breast exam (CBE) every year.  Starting at age 45, you should consider having a mammogram (breast X-ray) every year.  If you have a family history of breast cancer, talk to your caregiver about genetic screening.  If you are at high risk for breast cancer, talk to your caregiver about having an MRI and a mammogram every year.  Intestinal or Stomach Cancer: Tests to consider are a rectal exam, fecal occult blood, sigmoidoscopy, and colonoscopy. Women who are high risk may need to be screened at an earlier age and more often.  Cervical Cancer:  Beginning at age 31, you should have a Pap test every 3 years as long as the past 3 Pap tests have been normal.  If you have had past treatment for cervical cancer or a condition that could lead  to cancer, you need Pap tests and screening for cancer for at least 20 years after your treatment.  If you had a hysterectomy for a problem that was not cancer or a condition that could lead to cancer, then you no longer need Pap tests.  If you are between ages 34 and 3, and you have had normal Pap tests going back 10 years, you no longer need Pap tests.  If Pap tests have been discontinued, risk factors (such as a new sexual partner) need to be reassessed to determine if screening should be resumed.  Some medical problems can increase the chance of getting cervical cancer. In these cases, your caregiver may recommend more frequent screening and Pap tests.  Uterine Cancer: If you have vaginal bleeding after reaching menopause, you should notify your caregiver.  Ovarian Cancer: Other than yearly pelvic exams, there are no reliable tests available to screen for ovarian cancer at this time except for yearly pelvic exams.  Lung Cancer: Yearly chest X-rays can detect lung cancer and should be done on high risk women, such as cigarette smokers and women with chronic lung  disease (emphysema).  Skin Cancer: A complete body skin exam should be done at your yearly examination. Avoid overexposure to the sun and ultraviolet light lamps. Use a strong sun block cream when in the sun. All of these things are important for lowering the risk of skin cancer. MENOPAUSE Menopause Symptoms: Hormone therapy products are effective for treating symptoms associated with menopause:  Moderate to severe hot flashes.  Night sweats.  Mood swings.  Headaches.  Tiredness.  Loss of sex drive.  Insomnia.  Other symptoms. Hormone replacement carries certain risks, especially in older women. Women who use or are thinking about using estrogen or estrogen with progestin treatments should discuss that with their caregiver. Your caregiver will help you understand the benefits and risks. The ideal dose of hormone replacement therapy is not known. The Food and Drug Administration (FDA) has concluded that hormone therapy should be used only at the lowest doses and for the shortest amount of time to reach treatment goals.  OSTEOPOROSIS Protecting Against Bone Loss and Preventing Fracture If you use hormone therapy for prevention of bone loss (osteoporosis), the risks for bone loss must outweigh the risk of the therapy. Ask your caregiver about other medications known to be safe and effective for preventing bone loss and fractures. To guard against bone loss or fractures, the following is recommended:  If you are younger than age 72, take 1000 mg of calcium and at least 600 mg of Vitamin D per day.  If you are older than age 76 but younger than age 64, take 1200 mg of calcium and at least 600 mg of Vitamin D per day.  If you are older than age 47, take 1200 mg of calcium and at least 800 mg of Vitamin D per day. Smoking and excessive alcohol intake increases the risk of osteoporosis. Eat foods rich in calcium and vitamin D and do weight bearing exercises several times a week as your  caregiver suggests. DIABETES Diabetes Mellitus: If you have type I or type 2 diabetes, you should keep your blood sugar under control with diet, exercise, and recommended medication. Avoid starchy and fatty foods, and too many sweets. Being overweight can make diabetes control more difficult. COGNITION AND MEMORY Cognition and Memory: Menopausal hormone therapy is not recommended for the prevention of cognitive disorders such as Alzheimer's disease or memory loss.  DEPRESSION  Depression may occur at any age, but it is common in elderly women. This may be because of physical, medical, social (loneliness), or financial problems and needs. If you are experiencing depression because of medical problems and control of symptoms, talk to your caregiver about this. Physical activity and exercise may help with mood and sleep. Community and volunteer involvement may improve your sense of value and worth. If you have depression and you feel that the problem is getting worse or becoming severe, talk to your caregiver about which treatment options are best for you. ACCIDENTS  Accidents are common and can be serious in elderly woman. Prepare your house to prevent accidents. Eliminate throw rugs, place hand bars in bath, shower, and toilet areas. Avoid wearing high heeled shoes or walking on wet, snowy, and icy areas. Limit or stop driving if you have vision or hearing problems, or if you feel you are unsteady with your movements and reflexes. HEPATITIS C Hepatitis C is a type of viral infection affecting the liver. It is spread mainly through contact with blood from an infected person. It can be treated, but if left untreated, it can lead to severe liver damage over the years. Many people who are infected do not know that the virus is in their blood. If you are a "baby-boomer", it is recommended that you have one screening test for Hepatitis C. IMMUNIZATIONS  Several immunizations are important to consider having  during your senior years, including:   Tetanus, diphtheria, and pertussis booster shot.  Influenza every year before the flu season begins.  Pneumonia vaccine.  Shingles vaccine.  Others, as indicated based on your specific needs. Talk to your caregiver about these. Document Released: 10/24/2005 Document Revised: 01/16/2014 Document Reviewed: 06/19/2008 [facility] Patient Information 2015 [place], [place]. This information is not intended to replace advice given to you by your health care provider. Make sure you discuss any questions you have with your health care provider. Bone Health Our bones do many things. They provide structure, protect organs, anchor muscles, and store calcium. Adequate calcium in your diet and weight-bearing physical activity help build strong bones, improve bone amounts, and may reduce the risk of weakening of bones (osteoporosis) later in life. PEAK BONE MASS By age 58, the average woman has acquired most of her skeletal bone mass. A large decline occurs in older adults which increases the risk of osteoporosis. In women this occurs around the time of menopause. It is important for young girls to reach their peak bone mass in order to maintain bone health throughout life. A person with high bone mass as a young adult will be more likely to have a higher bone mass later in life. Not enough calcium consumption and physical activity early on could result in a failure to achieve optimum bone mass in adulthood. OSTEOPOROSIS Osteoporosis is a disease of the bones. It is defined as low bone mass with deterioration of bone structure. Osteoporosis leads to an increase risk of fractures with falls. These fractures commonly happen in the wrist, hip, and spine. While men and women of all ages and background can develop osteoporosis, some of the risk factors for osteoporosis are:  Female.  White.  Postmenopausal.  Older adults.  Small in body size.  Eating a diet low in  calcium.  Physically inactive.  Smoking.  Use of some medications.  Family history. CALCIUM Calcium is a mineral needed by the body for healthy bones, teeth, and proper function of the heart, muscles, and nerves.  The body cannot produce calcium so it must be absorbed through food. Good sources of calcium include:  Dairy products (low fat or nonfat milk, cheese, and yogurt).  Dark green leafy vegetables (bok choy and broccoli).  Calcium fortified foods (orange juice, cereal, bread, soy beverages, and tofu products).  Nuts (almonds). Recommended amounts of calcium vary for individuals. RECOMMENDED CALCIUM INTAKES Age and Amount in mg per day  Children 1 to 3 years / 700 mg  Children 4 to 8 years / 1,000 mg  Children 9 to 13 years / 1,300 mg  Teens 14 to 18 years / 1,300 mg  Adults 19 to 50 years / 1,000 mg  Adult women 51 to 70 years / 1,200 mg  Adults 71 years and older / 1,200 mg  Pregnant and breastfeeding teens / 1,300 mg  Pregnant and breastfeeding adults / 1,000 mg Vitamin D also plays an important role in healthy bone development. Vitamin D helps in the absorption of calcium. WEIGHT-BEARING PHYSICAL ACTIVITY Regular physical activity has many positive health benefits. Benefits include strong bones. Weight-bearing physical activity early in life is important in reaching peak bone mass. Weight-bearing physical activities cause muscles and bones to work against gravity. Some examples of weight bearing physical activities include:  Walking, jogging, or running.  [employer].  Jumping rope.  Dancing.  Soccer.  Tennis or Racquetball.  Stair climbing.  Basketball.  Hiking.  Weight lifting.  Aerobic fitness classes. Including weight-bearing physical activity into an exercise plan is a great way to keep bones healthy. Adults: Engage in at least 30 minutes of moderate physical activity on most, preferably all, days of the week. Children: Engage in at  least 60 minutes of moderate physical activity on most, preferably all, days of the week. FOR MORE INFORMATION [place] Web designer, Soil scientist for [employer] and Promotion: www.cnpp.usda.[place]: [employer].ee Document Released: 11/22/2003 Document Revised: 12/27/2012 Document Reviewed: 02/21/2009 [facility] Patient Information 2015 [place], [place]. This information is not intended to replace advice given to you by your health care provider. Make sure you discuss any questions you have with your health care provider. Introduction to Bowel Health Diet and daily habits can help you predict when your bowels will move on a regular basis.  The consistency and quantity of the stool is usually more important than the frequency.  The goal is to have a regular bowel movement that is soft but formed.   Tips on Emptying Regularly . Eat breakfast.  Usually the best time of day for a bowel movement will be a half hour to an hour after eating.  These times are best because the body uses the gastrocolic reflex, a stimulation of bowel motion that occurs with eating, to help produce a bowel movement.  For some people even a simple hot drink in the morning can help the reflex action begin. . Eat all your meals at a predictable time each day.  The bowel functions best when food is introduced at the same regular intervals. . The amount of food eaten at a given time of day should be about the same size from day to day.  The bowel functions best when food is introduced in similar quantities from day to day. It is fine to have a small breakfast and a large lunch, or vice versa, just be consistent. . Eat two servings of fruit or vegetables and at least one serving of a complex carbohydrates (whole grains such as brown rice, bran, whole wheat  bread, or oatmeal) at each meal. . Drink plenty of water-ideally eight glasses a day.  Be sure to increase your water intake if you are  increasing fiber into your diet.  Maintain Healthy Habits . Exercise daily.  You may exercise at any time of day, but you may find that bowel function is helped most if the exercise is at a consistent time each day. . Make sure that you are not rushed and have convenient access to a bathroom at your selected time to empty your bowels.    2007, [employer] Doc.14

## 2015-02-17 LAB — VITAMIN D 25 HYDROXY (VIT D DEFICIENCY, FRACTURES): Vit D, 25-Hydroxy: 29 ng/mL — ABNORMAL LOW (ref 30–100)

## 2015-02-17 LAB — COMPREHENSIVE METABOLIC PANEL
ALT: 57 U/L — ABNORMAL HIGH (ref 0–35)
AST: 36 U/L (ref 0–37)
Albumin: 4.5 g/dL (ref 3.5–5.2)
Alkaline Phosphatase: 78 U/L (ref 39–117)
BUN: 22 mg/dL (ref 6–23)
CO2: 28 mEq/L (ref 19–32)
Calcium: 9.1 mg/dL (ref 8.4–10.5)
Chloride: 100 mEq/L (ref 96–112)
Creat: 0.75 mg/dL (ref 0.50–1.10)
Glucose, Bld: 95 mg/dL (ref 70–99)
Potassium: 4.4 mEq/L (ref 3.5–5.3)
Sodium: 136 mEq/L (ref 135–145)
Total Bilirubin: 0.7 mg/dL (ref 0.2–1.2)
Total Protein: 6.6 g/dL (ref 6.0–8.3)

## 2015-02-17 LAB — LIPID PANEL
Cholesterol: 222 mg/dL — ABNORMAL HIGH (ref 0–200)
HDL: 43 mg/dL — ABNORMAL LOW (ref >=46)
LDL Cholesterol: 138 mg/dL — ABNORMAL HIGH (ref 0–99)
Total CHOL/HDL Ratio: 5.2 Ratio
Triglycerides: 207 mg/dL — ABNORMAL HIGH (ref <150)
VLDL: 41 mg/dL — ABNORMAL HIGH (ref 0–40)

## 2015-02-17 LAB — TSH: TSH: 1.479 u[IU]/mL (ref 0.350–4.500)

## 2015-02-17 LAB — SEDIMENTATION RATE: Sed Rate: 9 mm/hr (ref 0–30)

## 2015-02-17 LAB — C-REACTIVE PROTEIN: CRP: 0.5 mg/dL (ref <0.60)

## 2015-02-20 ENCOUNTER — Encounter: Payer: Self-pay | Admitting: Family Medicine

## 2015-02-20 DIAGNOSIS — M5136 Other intervertebral disc degeneration, lumbar region: Secondary | ICD-10-CM

## 2015-03-01 ENCOUNTER — Encounter: Payer: Self-pay | Admitting: Family Medicine

## 2015-03-01 NOTE — Telephone Encounter (Signed)
Referred pt to [facility] for her low back per her request - please have pt come by to pick up a copy of her cd of her lumbar xray to bring to her appt w/ her - also, please  Burn the cd.

## 2015-03-20 ENCOUNTER — Encounter: Payer: Self-pay | Admitting: Family Medicine

## 2015-03-21 ENCOUNTER — Other Ambulatory Visit: Payer: Self-pay

## 2015-03-21 DIAGNOSIS — Z1231 Encounter for screening mammogram for malignant neoplasm of breast: Secondary | ICD-10-CM

## 2015-03-21 LAB — IFOBT (OCCULT BLOOD): IFOBT: NEGATIVE

## 2015-03-21 NOTE — Addendum Note (Signed)
Addended by: Tommas Olp B on: 03/21/2015 02:01 PM   Modules accepted: Orders

## 2015-03-22 ENCOUNTER — Telehealth: Payer: Self-pay

## 2015-03-22 ENCOUNTER — Telehealth: Payer: Self-pay | Admitting: Acute Care

## 2015-03-22 NOTE — Telephone Encounter (Signed)
Eric Form, NP called concerning pt's order for CT Chest CA Screening. It will need to be done at [facility] and Judson Roch is calling them to make sure they see the order in [facility] and call pt to schedule. She will also contact pt to advise her that [facility] will be calling re: appt. The only thing our Referral Dept needs to do is take care of getting pre-cert for procedure CPT code: G0297, and Dx code: Z87.891. Pt is willing to pay OOP if need be but will need to see if it is covered by ins to know if she needs to have the money to pay at appt.

## 2015-03-22 NOTE — Telephone Encounter (Signed)
I returned a call to this patient who called to schedule a lung cancer screening. She had been referred to [place] by Dr. [name]. Upon obtaining a smoking history it was determined that due to the fact this patient had quit smoking >20 years ago she did not qualify for the program. Despite this she is still interested in having the screening CT done due to her history( which included smoking 3 ppd , but she did not disclose the number of years she smoked.) She understands that it is a $299.00 scan, but she is going to try to get it pre-certified by her insurance through Dr. Raul Del office. I have called Dr. Raul Del office and let them know that the patient did not qualify for the program, but still wants the scan done. I have also called [facility] to make them aware there is an order for the scan and that it needs to be scheduled. They informed me that the order in Epic needs to be faxed to them as they do not work off any type of a que. I have called Dr. Raul Del office, and have spoken with Pamala Hurry about this patient. She is aware the order needs to be faxed to [facility] for them to schedule the scan for the patient. Both the patient and Dr. Raul Del office have my contact information in the event they have any further questions or concerns.

## 2015-03-22 NOTE — Telephone Encounter (Signed)
Angela Graves, this is the order I spoke to you about. The NP called me back and advised (after talking to [facility]) that [facility] does not work out of the queue. The order has to be faxed to them. Thanks!

## 2015-03-26 ENCOUNTER — Encounter: Payer: Self-pay | Admitting: Family Medicine

## 2015-03-28 ENCOUNTER — Inpatient Hospital Stay: Admission: RE | Admit: 2015-03-28 | Payer: BLUE CROSS/BLUE SHIELD | Source: Ambulatory Visit

## 2015-04-11 ENCOUNTER — Other Ambulatory Visit: Payer: Self-pay

## 2015-04-11 ENCOUNTER — Inpatient Hospital Stay: Admission: RE | Admit: 2015-04-11 | Payer: BLUE CROSS/BLUE SHIELD | Source: Ambulatory Visit

## 2015-04-11 DIAGNOSIS — Z87891 Personal history of nicotine dependence: Secondary | ICD-10-CM

## 2015-04-23 ENCOUNTER — Ambulatory Visit
Admission: RE | Admit: 2015-04-23 | Discharge: 2015-04-23 | Disposition: A | Discharge status: Home / Self Care | Payer: BLUE CROSS/BLUE SHIELD | Source: Ambulatory Visit

## 2015-04-23 DIAGNOSIS — Z1231 Encounter for screening mammogram for malignant neoplasm of breast: Secondary | ICD-10-CM

## 2015-04-25 ENCOUNTER — Other Ambulatory Visit: Payer: BLUE CROSS/BLUE SHIELD

## 2015-06-30 ENCOUNTER — Other Ambulatory Visit: Payer: Self-pay | Admitting: Family Medicine

## 2015-07-05 NOTE — Telephone Encounter (Signed)
Rx faxed

## 2015-07-11 ENCOUNTER — Other Ambulatory Visit: Payer: Self-pay | Admitting: Family Medicine

## 2015-07-11 ENCOUNTER — Ambulatory Visit (INDEPENDENT_AMBULATORY_CARE_PROVIDER_SITE_OTHER): Payer: BLUE CROSS/BLUE SHIELD | Admitting: Family Medicine

## 2015-07-11 VITALS — BP 124/80 | HR 68 | Temp 97.6°F | Resp 18 | Ht 62.0 in | Wt 146.0 lb

## 2015-07-11 DIAGNOSIS — R110 Nausea: Secondary | ICD-10-CM | POA: Diagnosis not present

## 2015-07-11 DIAGNOSIS — R7401 Elevation of levels of liver transaminase levels: Secondary | ICD-10-CM

## 2015-07-11 DIAGNOSIS — R1013 Epigastric pain: Secondary | ICD-10-CM

## 2015-07-11 DIAGNOSIS — E559 Vitamin D deficiency, unspecified: Secondary | ICD-10-CM

## 2015-07-11 DIAGNOSIS — E7800 Pure hypercholesterolemia, unspecified: Secondary | ICD-10-CM | POA: Diagnosis not present

## 2015-07-11 DIAGNOSIS — R740 Nonspecific elevation of levels of transaminase and lactic acid dehydrogenase [LDH]: Secondary | ICD-10-CM

## 2015-07-11 LAB — COMPREHENSIVE METABOLIC PANEL
ALT: 60 U/L — ABNORMAL HIGH (ref 6–29)
AST: 37 U/L — ABNORMAL HIGH (ref 10–35)
Albumin: 4.8 g/dL (ref 3.6–5.1)
Alkaline Phosphatase: 104 U/L (ref 33–130)
BUN: 15 mg/dL (ref 7–25)
CO2: 28 mmol/L (ref 20–31)
Calcium: 9.6 mg/dL (ref 8.6–10.4)
Chloride: 99 mmol/L (ref 98–110)
Creat: 0.77 mg/dL (ref 0.50–0.99)
Glucose, Bld: 91 mg/dL (ref 65–99)
Potassium: 4.5 mmol/L (ref 3.5–5.3)
Sodium: 138 mmol/L (ref 135–146)
Total Bilirubin: 0.9 mg/dL (ref 0.2–1.2)
Total Protein: 7.5 g/dL (ref 6.1–8.1)

## 2015-07-11 LAB — LIPID PANEL
Cholesterol: 265 mg/dL — ABNORMAL HIGH (ref 125–200)
HDL: 47 mg/dL (ref >=46)
LDL Cholesterol: 169 mg/dL — ABNORMAL HIGH (ref <130)
Total CHOL/HDL Ratio: 5.6 Ratio — ABNORMAL HIGH (ref <=5.0)
Triglycerides: 247 mg/dL — ABNORMAL HIGH (ref <150)
VLDL: 49 mg/dL — ABNORMAL HIGH (ref <30)

## 2015-07-11 MED ORDER — PROMETHAZINE HCL 25 MG PO TABS: 25.0000 mg | ORAL_TABLET | Freq: Three times a day (TID) | ORAL | Status: DC | PRN

## 2015-07-11 MED ORDER — DIAZEPAM 5 MG PO TABS: 5.0000 mg | ORAL_TABLET | Freq: Two times a day (BID) | ORAL | Status: DC | PRN

## 2015-07-11 NOTE — Patient Instructions (Signed)
Reatha - YOU ARE DOING AMAZING - I AM SO IMPRESSED AND TRULY BLOWN AWAY - IT JUST GOES TO SHOW WHAT A WOMAN CAN DO IF SHE PUTS HER MIND TO IT - I'M ESP EXCITED ABOUT YOUR BACK PAIN BEING RELIEVED - IF YOU HAVE TIME TO SEND ME AN EMAIL WITH RECIPE TO YOUR AMAZING SMOOTHIE - I WANT TO GIVE IT A SHOT (HAHAHA.)

## 2015-07-11 NOTE — Progress Notes (Addendum)
Subjective:  This chart was scribed for [name] MD, by [name], at [facility].  This patient was seen in [place] [facility] and the patient's care was started at 8:43 AM.    Patient ID: Angela Graves, female    DOB: [birth year], 59 y.o.   MRN: [id number] Chief Complaint  Patient presents with  . Follow-up    cpe and fasting labs  . Medication Refill    phenergan     HPI  HPI Comments: Angela Graves is a 59 y.o. female who presents to the [facility] for a follow up.  Patient would also like to have a refill for phenergan which she takes occasionally when her migraines come on or when she has motion sickness while she is traveling.  Patient states that she is doing very well.  Her blood pressure outside of the office ranges from 126-149 (most days) and on days she doesn't take the valium, its higher.  She takes a half a tablet of Valium every day in the mornings and feels that it really helps with tackling the daily tasks she has.  She states the valium has also helped her significantly with her chest pain and spasms.  She has changed hier diet significantly and eats more fish, beans potatoes and avocados.  She saw a physical therapist for her herniated disc and states that it did not help her much so she has incorporated foods that help with inflammation instead.  These foods have helped her pains significantly as she is able to walk much more now.  She is currently complaint with her vitamin D and is no longer using her inhaler.  She states that she would not like to take Osphena any longer and is now using topical moisturizer and lubricants. Patient states that her grandson is "driving her crazy" and he is currently flunking two of his classes.  He is currently home for fall break and is a Paramedic in college.    ------- Patient seen 5 months ago with CPE. She was a new patient to me and years prior had been on a steady dose of valium which treated  anxiety induced chest pains and shortness of breath.  She was having increasing anxiety due to stress and life situations caring for her grandson with Asperger's.  Blood pressure was elevated and she was going to monitor outside the office as presumed white coat hypertension.  She had musculoskeletal pain and was going to follow up with orthopedics. Patients vitamin D was borderline low and she was instructed to increase it.  She was also reccommended  to increase omega 3's to help with cholesterol.  She had a mild trans-aminitis which we are hoping to improve through diet and exercise as assumed due to fatty liver.  Patient e-mailed in asking about starting osphena due to dyspareunia and vaginitis.  She has a hysterectomy when she was young and did not want to try hormone replacement therapy. Patient quit smoking more than 20 years ago so she did not qualify for lung caner screening CT however she opted to have it done and pay for it out of pocket, this has not been done yet.     Past Medical History  Diagnosis Date  . Migraine     Current Outpatient Prescriptions on File Prior to Visit  Medication Sig Dispense Refill  . diazepam (VALIUM) 5 MG tablet TAKE 1 TABLET BY MOUTH EVERY 12 HOURS AS NEEDED  FOR ANXIETY 30 tablet 1  . promethazine (PHENERGAN) 25 MG tablet Take 1 tablet (25 mg total) by mouth every 8 (eight) hours as needed for nausea. 10 tablet 0  . albuterol (PROVENTIL HFA;VENTOLIN HFA) 108 (90 BASE) MCG/ACT inhaler Inhale 2 puffs into the lungs every 4 (four) hours as needed for wheezing or shortness of breath (cough, shortness of breath or wheezing.). (Patient not taking: Reported on 07/11/2015) 1 Inhaler 1   No current facility-administered medications on file prior to visit.    Allergies  Allergen Reactions  . Codeine Itching  . Morphine And Related Other (See Comments)    Hyperactive       Review of Systems  Constitutional: Negative for fever and chills.  Eyes: Negative for  pain, redness and itching.  Respiratory: Negative for cough, choking and shortness of breath.   Cardiovascular: Negative for chest pain.  Gastrointestinal: Negative for nausea and vomiting.  Musculoskeletal: Negative for neck pain and neck stiffness.       Objective:   Physical Exam  Constitutional: She is oriented to person, place, and time. She appears well-developed. No distress.  HENT:  Head: Normocephalic and atraumatic.  Eyes: Pupils are equal, round, and reactive to light.  Neck: Normal range of motion. Neck supple. No thyromegaly present.  Cardiovascular: Normal rate, regular rhythm, S1 normal, S2 normal and normal heart sounds.  Exam reveals no friction rub.   No murmur heard. Pulmonary/Chest: Effort normal and breath sounds normal. No respiratory distress. She has no wheezes. She has no rales. She exhibits no tenderness.  Musculoskeletal: Normal range of motion.  Neurological: She is alert and oriented to person, place, and time.  Skin: Skin is warm and dry.  Psychiatric: She has a normal mood and affect. Her behavior is normal.  Nursing note and vitals reviewed.   Filed Vitals:   07/11/15 0817  BP: 124/80  Pulse: 68  Temp: 97.6 F (36.4 C)  TempSrc: Oral  Resp: 18  Height: 5\' 2"  (1.575 m)  Weight: 146 lb (66.225 kg)  SpO2: 98%         Assessment & Plan:   1. Transaminitis - suspected due to fatty liver, pt has made excellent diet changes, recheck.  2. Elevated cholesterol - pt has done GREAT at tlc so very disappointing - she needs to look closer into her diet to see what may be   3. Epigastric pain   4. Nausea   5. Vitamin D deficiency - on 1000u/d otc  6.      Anxiety - doing great - often just taking 1/2 tab of valium a day - okay to refill valium as needed until due for a physical in June 2017 as long as patient is stable.  7.     Migraines - refilled promethazine - uses rarely - for motion sickness while traveling as well 8.     Dyspareunia - resolved  w/ otc topicals - replenish brand.  Orders Placed This Encounter  Procedures  . Lipid panel    Order Specific Question:  Has the patient fasted?    Answer:  Yes  . Comprehensive metabolic panel    Order Specific Question:  Has the patient fasted?    Answer:  Yes  . Vit D  25 hydroxy (rtn osteoporosis monitoring)    Meds ordered this encounter  Medications  . diazepam (VALIUM) 5 MG tablet    Sig: Take 1 tablet (5 mg total) by mouth every 12 (twelve) hours as needed.  for anxiety    Dispense:  30 tablet    Refill:  1    May fill on or after 08/15/14.  Marland Kitchen promethazine (PHENERGAN) 25 MG tablet    Sig: Take 1 tablet (25 mg total) by mouth every 8 (eight) hours as needed for nausea.    Dispense:  30 tablet    Refill:  3    I personally performed the services described in this documentation, which was scribed in my presence. The recorded information has been reviewed and considered, and addended by me as needed.  [name], MD MPH   Results for orders placed or performed in visit on 07/11/15  Lipid panel  Result Value Ref Range   Cholesterol 265 (H) 125 - 200 mg/dL   Triglycerides 247 (H) <150 mg/dL   HDL 47 >=46 mg/dL   Total CHOL/HDL Ratio 5.6 (H) <=5.0 Ratio   VLDL 49 (H) <30 mg/dL   LDL Cholesterol 169 (H) <130 mg/dL  Comprehensive metabolic panel  Result Value Ref Range   Sodium 138 135 - 146 mmol/L   Potassium 4.5 3.5 - 5.3 mmol/L   Chloride 99 98 - 110 mmol/L   CO2 28 20 - 31 mmol/L   Glucose, Bld 91 65 - 99 mg/dL   BUN 15 7 - 25 mg/dL   Creat 0.77 0.50 - 0.99 mg/dL   Total Bilirubin 0.9 0.2 - 1.2 mg/dL   Alkaline Phosphatase 104 33 - 130 U/L   AST 37 (H) 10 - 35 U/L   ALT 60 (H) 6 - 29 U/L   Total Protein 7.5 6.1 - 8.1 g/dL   Albumin 4.8 3.6 - 5.1 g/dL   Calcium 9.6 8.6 - 10.4 mg/dL  Vit D  25 hydroxy (rtn osteoporosis monitoring)  Result Value Ref Range   Vit D, 25-Hydroxy  30 - 100 ng/mL

## 2015-07-12 ENCOUNTER — Encounter: Payer: Self-pay | Admitting: Family Medicine

## 2015-07-12 LAB — VITAMIN D 25 HYDROXY (VIT D DEFICIENCY, FRACTURES): Vit D, 25-Hydroxy: 35 ng/mL (ref 30–100)

## 2015-07-12 NOTE — Telephone Encounter (Signed)
-----   Message from Shawnee Knapp, MD sent at 07/11/2015  9:00 AM EDT ----- [facility] CPE FOR AFTER June 2017, [facility]

## 2015-07-12 NOTE — Telephone Encounter (Signed)
Pt was driving and will call back to make appt for CPE. However, she has concerns about elevated LFTs and wants to know what to do? She sent a message through [employer]. Best number [phone].

## 2015-07-13 ENCOUNTER — Telehealth: Payer: Self-pay | Admitting: *Deleted

## 2015-07-13 NOTE — Telephone Encounter (Signed)
Pt was told to call back if she has not heard from you.  She is very concerned about her liver enzymes.  She would like to be tested for Hep C, which we can probably add for you and also she would like to go ahead and get the ultrasound of her liver instead of waiting for next visit.  She states for ins purposes it would be good for her to get the ultrasound before the end of the year.  She also sent an email.

## 2015-07-13 NOTE — Addendum Note (Signed)
Addended by: [name] on: 07/13/2015 06:58 PM   Modules accepted: Orders

## 2015-07-13 NOTE — Telephone Encounter (Signed)
Yes, please add on hep C due to transaminitis - if we could get a whole acute hep panel that would be great. Called pt and LVM - she should stop her fish oil/omega-3 supps, might want to consider stopping all of her supplements that she has started since her last visit just to see - can RTC in about 6 wks to recheck labs. Ok to do it earlier if she wants.  Will order liver US.

## 2015-07-16 LAB — HEPATITIS PANEL, ACUTE
HCV Ab: NEGATIVE
Hep A IgM: NONREACTIVE
Hep B C IgM: NONREACTIVE
Hepatitis B Surface Ag: NEGATIVE

## 2015-07-16 NOTE — Telephone Encounter (Signed)
Spoke with [employer]. Acute Hep Panel ordered.

## 2015-07-23 ENCOUNTER — Ambulatory Visit
Admission: RE | Admit: 2015-07-23 | Discharge: 2015-07-23 | Disposition: A | Discharge status: Home / Self Care | Payer: Commercial Managed Care - HMO | Source: Ambulatory Visit | Attending: Family Medicine | Admitting: Family Medicine

## 2015-07-23 DIAGNOSIS — R110 Nausea: Secondary | ICD-10-CM

## 2015-07-23 DIAGNOSIS — E7800 Pure hypercholesterolemia, unspecified: Secondary | ICD-10-CM

## 2015-07-23 DIAGNOSIS — R1013 Epigastric pain: Secondary | ICD-10-CM

## 2015-07-23 DIAGNOSIS — R740 Nonspecific elevation of levels of transaminase and lactic acid dehydrogenase [LDH]: Principal | ICD-10-CM

## 2015-07-23 DIAGNOSIS — R7401 Elevation of levels of liver transaminase levels: Secondary | ICD-10-CM

## 2015-11-15 ENCOUNTER — Other Ambulatory Visit: Payer: Self-pay | Admitting: Family Medicine

## 2015-11-19 NOTE — Telephone Encounter (Signed)
Faxed

## 2015-11-30 ENCOUNTER — Telehealth: Payer: Self-pay

## 2015-11-30 NOTE — Telephone Encounter (Signed)
Waiting on payment of 49.25 for 87 pages. From [facility]

## 2015-12-06 NOTE — Telephone Encounter (Signed)
Paid received and records faxed to [facility] on 12/06/15

## 2015-12-25 DIAGNOSIS — Z0271 Encounter for disability determination: Secondary | ICD-10-CM

## 2016-02-01 ENCOUNTER — Other Ambulatory Visit: Payer: Self-pay | Admitting: Family Medicine

## 2016-02-05 ENCOUNTER — Telehealth: Payer: Self-pay

## 2016-02-05 NOTE — Telephone Encounter (Signed)
Pt did agree to f/up with Dr Brigitte Pulse soon. She just was going to bring in the labs to go over with Dr Brigitte Pulse instead of planning to run them all again if not necessary.

## 2016-02-05 NOTE — Telephone Encounter (Signed)
Still rec OV at her convenience in the next sev mos - her prior blood work was abnml - if she drops off her labs we will scan them in but she was having some abnormalities in her lipids and liver it would be good to have an OV so we can review the labs, compare them to prior, and assess changes from prior, as well as will need OV every 6 months for controlled medication and she was last seen in Oct.

## 2016-02-05 NOTE — Telephone Encounter (Signed)
Needs OV for additional refills - also needs fasting labs at that time

## 2016-02-05 NOTE — Telephone Encounter (Signed)
Opened in error, duplicate.

## 2016-02-05 NOTE — Telephone Encounter (Signed)
Faxed and notified pt of Rf and need for f/up. Pt agreed, but reported that she just had really comprehensive labs done by [employer] w/in the last 2 mos (include lipids) and will plan to just bring in those results for Dr Brigitte Pulse to review unless she hears back otherwise. I advised that I thought that would be fine w/Dr Brigitte Pulse but would send message to her to confirm. OK, Dr Brigitte Pulse?

## 2016-04-10 ENCOUNTER — Encounter: Payer: Self-pay | Admitting: Family Medicine

## 2016-04-10 ENCOUNTER — Ambulatory Visit (INDEPENDENT_AMBULATORY_CARE_PROVIDER_SITE_OTHER): Payer: Commercial Managed Care - HMO | Admitting: Family Medicine

## 2016-04-10 VITALS — BP 120/78 | HR 78 | Temp 98.1°F | Resp 16 | Ht 62.25 in | Wt 143.2 lb

## 2016-04-10 DIAGNOSIS — Z136 Encounter for screening for cardiovascular disorders: Secondary | ICD-10-CM

## 2016-04-10 DIAGNOSIS — Z1389 Encounter for screening for other disorder: Secondary | ICD-10-CM

## 2016-04-10 DIAGNOSIS — Z1239 Encounter for other screening for malignant neoplasm of breast: Secondary | ICD-10-CM

## 2016-04-10 DIAGNOSIS — Z1329 Encounter for screening for other suspected endocrine disorder: Secondary | ICD-10-CM | POA: Diagnosis not present

## 2016-04-10 DIAGNOSIS — Z0000 Encounter for general adult medical examination without abnormal findings: Secondary | ICD-10-CM | POA: Diagnosis not present

## 2016-04-10 DIAGNOSIS — F411 Generalized anxiety disorder: Secondary | ICD-10-CM

## 2016-04-10 DIAGNOSIS — Z113 Encounter for screening for infections with a predominantly sexual mode of transmission: Secondary | ICD-10-CM

## 2016-04-10 DIAGNOSIS — Z130 Encounter for screening for diseases of the blood and blood-forming organs and certain disorders involving the immune mechanism: Secondary | ICD-10-CM | POA: Diagnosis not present

## 2016-04-10 DIAGNOSIS — D72819 Decreased white blood cell count, unspecified: Secondary | ICD-10-CM

## 2016-04-10 DIAGNOSIS — R7401 Elevation of levels of liver transaminase levels: Secondary | ICD-10-CM

## 2016-04-10 DIAGNOSIS — K5909 Other constipation: Secondary | ICD-10-CM

## 2016-04-10 DIAGNOSIS — Z1212 Encounter for screening for malignant neoplasm of rectum: Secondary | ICD-10-CM

## 2016-04-10 DIAGNOSIS — R740 Nonspecific elevation of levels of transaminase and lactic acid dehydrogenase [LDH]: Secondary | ICD-10-CM | POA: Diagnosis not present

## 2016-04-10 DIAGNOSIS — E559 Vitamin D deficiency, unspecified: Secondary | ICD-10-CM

## 2016-04-10 DIAGNOSIS — Z1211 Encounter for screening for malignant neoplasm of colon: Secondary | ICD-10-CM | POA: Diagnosis not present

## 2016-04-10 DIAGNOSIS — Z124 Encounter for screening for malignant neoplasm of cervix: Secondary | ICD-10-CM | POA: Diagnosis not present

## 2016-04-10 DIAGNOSIS — E785 Hyperlipidemia, unspecified: Secondary | ICD-10-CM

## 2016-04-10 DIAGNOSIS — K5900 Constipation, unspecified: Secondary | ICD-10-CM

## 2016-04-10 DIAGNOSIS — Z1383 Encounter for screening for respiratory disorder NEC: Secondary | ICD-10-CM

## 2016-04-10 MED ORDER — DIAZEPAM 5 MG PO TABS: 5.0000 mg | ORAL_TABLET | Freq: Two times a day (BID) | ORAL | 5 refills | Status: DC | PRN

## 2016-04-10 NOTE — Patient Instructions (Addendum)
Consider biotin vitamin. Consider listerine   IF you received an x-ray today, you will receive an invoice from [facility]. Please contact [facility] at [phone] with questions or concerns regarding your invoice.   IF you received labwork today, you will receive an invoice from [employer]. Please contact [facility] at [phone] with questions or concerns regarding your invoice.   Our billing staff will not be able to assist you with questions regarding bills from these companies.  You will be contacted with the lab results as soon as they are available. The fastest way to get your results is to activate your My Chart account. Instructions are located on the last page of this paperwork. If you have not heard from [place] regarding the results in 2 weeks, please contact this office.     Menopause is a normal process in which your reproductive ability comes to an end. This process happens gradually over a span of months to years, usually between the ages of 41 and 80. Menopause is complete when you have missed 12 consecutive menstrual periods. It is important to talk with your health care provider about some of the most common conditions that affect postmenopausal women, such as heart disease, cancer, and bone loss (osteoporosis). Adopting a healthy lifestyle and getting preventive care can help to promote your health and wellness. Those actions can also lower your chances of developing some of these common conditions. WHAT SHOULD I KNOW ABOUT MENOPAUSE? During menopause, you may experience a number of symptoms, such as:  Moderate-to-severe hot flashes.  Night sweats.  Decrease in sex drive.  Mood swings.  Headaches.  Tiredness.  Irritability.  Memory problems.  Insomnia. Choosing to treat or not to treat menopausal changes is an individual decision that you make with your health care provider. WHAT SHOULD I KNOW ABOUT HORMONE  REPLACEMENT THERAPY AND SUPPLEMENTS? Hormone therapy products are effective for treating symptoms that are associated with menopause, such as hot flashes and night sweats. Hormone replacement carries certain risks, especially as you become older. If you are thinking about using estrogen or estrogen with progestin treatments, discuss the benefits and risks with your health care provider. WHAT SHOULD I KNOW ABOUT HEART DISEASE AND STROKE? Heart disease, heart attack, and stroke become more likely as you age. This may be due, in part, to the hormonal changes that your body experiences during menopause. These can affect how your body processes dietary fats, triglycerides, and cholesterol. Heart attack and stroke are both medical emergencies. There are many things that you can do to help prevent heart disease and stroke:  Have your blood pressure checked at least every 1-2 years. High blood pressure causes heart disease and increases the risk of stroke.  If you are 9-21 years old, ask your health care provider if you should take aspirin to prevent a heart attack or a stroke.  Do not use any tobacco products, including cigarettes, chewing tobacco, or electronic cigarettes. If you need help quitting, ask your health care provider.  It is important to eat a healthy diet and maintain a healthy weight.  Be sure to include plenty of vegetables, fruits, low-fat dairy products, and lean protein.  Avoid eating foods that are high in solid fats, added sugars, or salt (sodium).  Get regular exercise. This is one of the most important things that you can do for your health.  Try to exercise for at least 150 minutes each week. The type of exercise that you do should increase  your heart rate and make you sweat. This is known as moderate-intensity exercise.  Try to do strengthening exercises at least twice each week. Do these in addition to the moderate-intensity exercise.  Know your numbers.Ask your health  care provider to check your cholesterol and your blood glucose. Continue to have your blood tested as directed by your health care provider. WHAT SHOULD I KNOW ABOUT CANCER SCREENING? There are several types of cancer. Take the following steps to reduce your risk and to catch any cancer development as early as possible. Breast Cancer  Practice breast self-awareness.  This means understanding how your breasts normally appear and feel.  It also means doing regular breast self-exams. Let your health care provider know about any changes, no matter how small.  If you are 37 or older, have a clinician do a breast exam (clinical breast exam or CBE) every year. Depending on your age, family history, and medical history, it may be recommended that you also have a yearly breast X-ray (mammogram).  If you have a family history of breast cancer, talk with your health care provider about genetic screening.  If you are at high risk for breast cancer, talk with your health care provider about having an MRI and a mammogram every year.  Breast cancer (BRCA) gene test is recommended for women who have family members with BRCA-related cancers. Results of the assessment will determine the need for genetic counseling and BRCA1 and for BRCA2 testing. BRCA-related cancers include these types:  Breast. This occurs in males or females.  Ovarian.  Tubal. This may also be called fallopian tube cancer.  Cancer of the abdominal or pelvic lining (peritoneal cancer).  Prostate.  Pancreatic. Cervical, Uterine, and Ovarian Cancer Your health care provider may recommend that you be screened regularly for cancer of the pelvic organs. These include your ovaries, uterus, and vagina. This screening involves a pelvic exam, which includes checking for microscopic changes to the surface of your cervix (Pap test).  For women ages 21-65, health care providers may recommend a pelvic exam and a Pap test every three years. For  women ages 76-65, they may recommend the Pap test and pelvic exam, combined with testing for human papilloma virus (HPV), every five years. Some types of HPV increase your risk of cervical cancer. Testing for HPV may also be done on women of any age who have unclear Pap test results.  Other health care providers may not recommend any screening for nonpregnant women who are considered low risk for pelvic cancer and have no symptoms. Ask your health care provider if a screening pelvic exam is right for you.  If you have had past treatment for cervical cancer or a condition that could lead to cancer, you need Pap tests and screening for cancer for at least 20 years after your treatment. If Pap tests have been discontinued for you, your risk factors (such as having a new sexual partner) need to be reassessed to determine if you should start having screenings again. Some women have medical problems that increase the chance of getting cervical cancer. In these cases, your health care provider may recommend that you have screening and Pap tests more often.  If you have a family history of uterine cancer or ovarian cancer, talk with your health care provider about genetic screening.  If you have vaginal bleeding after reaching menopause, tell your health care provider.  There are currently no reliable tests available to screen for ovarian cancer. Lung Cancer Lung  cancer screening is recommended for adults 59-34 years old who are at high risk for lung cancer because of a history of smoking. A yearly low-dose CT scan of the lungs is recommended if you:  Currently smoke.  Have a history of at least 30 Venna Berberich-years of smoking and you currently smoke or have quit within the past 15 years. A Jaycee Pelzer-year is smoking an average of one Kara Mierzejewski of cigarettes per day for one year. Yearly screening should:  Continue until it has been 15 years since you quit.  Stop if you develop a health problem that would prevent you from  having lung cancer treatment. Colorectal Cancer  This type of cancer can be detected and can often be prevented.  Routine colorectal cancer screening usually begins at age 88 and continues through age 26.  If you have risk factors for colon cancer, your health care provider may recommend that you be screened at an earlier age.  If you have a family history of colorectal cancer, talk with your health care provider about genetic screening.  Your health care provider may also recommend using home test kits to check for hidden blood in your stool.  A small camera at the end of a tube can be used to examine your colon directly (sigmoidoscopy or colonoscopy). This is done to check for the earliest forms of colorectal cancer.  Direct examination of the colon should be repeated every 5-10 years until age 55. However, if early forms of precancerous polyps or small growths are found or if you have a family history or genetic risk for colorectal cancer, you may need to be screened more often. Skin Cancer  Check your skin from head to toe regularly.  Monitor any moles. Be sure to tell your health care provider:  About any new moles or changes in moles, especially if there is a change in a mole's shape or color.  If you have a mole that is larger than the size of a pencil eraser.  If any of your family members has a history of skin cancer, especially at a young age, talk with your health care provider about genetic screening.  Always use sunscreen. Apply sunscreen liberally and repeatedly throughout the day.  Whenever you are outside, protect yourself by wearing long sleeves, pants, a wide-brimmed hat, and sunglasses. WHAT SHOULD I KNOW ABOUT OSTEOPOROSIS? Osteoporosis is a condition in which bone destruction happens more quickly than new bone creation. After menopause, you may be at an increased risk for osteoporosis. To help prevent osteoporosis or the bone fractures that can happen because of  osteoporosis, the following is recommended:  If you are 54-24 years old, get at least 1,000 mg of calcium and at least 600 mg of vitamin D per day.  If you are older than age 10 but younger than age 63, get at least 1,200 mg of calcium and at least 600 mg of vitamin D per day.  If you are older than age 68, get at least 1,200 mg of calcium and at least 800 mg of vitamin D per day. Smoking and excessive alcohol intake increase the risk of osteoporosis. Eat foods that are rich in calcium and vitamin D, and do weight-bearing exercises several times each week as directed by your health care provider. WHAT SHOULD I KNOW ABOUT HOW MENOPAUSE AFFECTS [place]? Depression may occur at any age, but it is more common as you become older. Common symptoms of depression include:  Low or sad mood.  Changes in sleep patterns.  Changes in appetite or eating patterns.  Feeling an overall lack of motivation or enjoyment of activities that you previously enjoyed.  Frequent crying spells. Talk with your health care provider if you think that you are experiencing depression. WHAT SHOULD I KNOW ABOUT IMMUNIZATIONS? It is important that you get and maintain your immunizations. These include:  Tetanus, diphtheria, and pertussis (Tdap) booster vaccine.  Influenza every year before the flu season begins.  Pneumonia vaccine.  Shingles vaccine. Your health care provider may also recommend other immunizations.   This information is not intended to replace advice given to you by your health care provider. Make sure you discuss any questions you have with your health care provider.   Document Released: 10/24/2005 Document Revised: 09/22/2014 Document Reviewed: 05/04/2014 Elsevier Interactive Patient Education [employer].

## 2016-04-10 NOTE — Progress Notes (Signed)
Subjective:    Patient ID: Angela Graves, female    DOB: Dec 27, 1955, 60 y.o.   MRN: [id number] Chief Complaint  Patient presents with  . Annual Exam    Patient had labs done in March through her life insurance, patient has results with her     HPI  Angela Graves is a 60 yo woman here for her full physical. I saw her last yr for her CPE on 02/16/2015  Situational elevated blood pressure after driving. Is nml at home. Has never needed BP medication.  Chronic constipation: controlled by daily miralax  Anxiety: triggers occ SHoB or substernal spasms poss in diaphragm with pain into both sides of her chest and back lasting for 30 min to a few hrs and occur every few wks.  We restarted valium which she had been on years prior  Primary Preventative screening: Immunizations: has declines zostavax and tetanus - has had shingles prior and would rather have that again than the vaccine Cervical: s/p hysterectomy in 1979 for ovarian cyst and endometriosis so does not need any further paps Mam: 04/23/2015 normal. CRS: does not want colonoscopy, had neg IFOBT 1 yr prior EKG: 03/14/12 NSR  Long h/o tobacco use: 60 yo to 60 yo x 3ppd but does not qualify for screening lung CT as stopped to long ago but pt considered paing out of pocket for screening CT  Vit D borderline low at 27 and had improved to 35 9 mos prior.  Elevated LFTs: US showed fatty liver  Has already had strong nails until recently and now have started splintering and lines  doing a lot of anti-inflammatory foods -  Phenergan at home for migraines and motion sickness with flying - travels 2-3x/yr which induces migraines gransdom living with her and she is moving which is highly stressful - he is reducing some of his college classes Husband going ot retire.   Past Medical History:  Diagnosis Date  . Migraine    Past Surgical History:  Procedure Laterality Date  . ABDOMINAL HYSTERECTOMY    . CHOLECYSTECTOMY    . JOINT  REPLACEMENT     Current Outpatient Prescriptions on File Prior to Visit  Medication Sig Dispense Refill  . promethazine (PHENERGAN) 25 MG tablet Take 1 tablet (25 mg total) by mouth every 8 (eight) hours as needed for nausea. 30 tablet 3   No current facility-administered medications on file prior to visit.    Allergies  Allergen Reactions  . Codeine Itching  . Morphine And Related Other (See Comments)    Hyperactive   Family History  Problem Relation Age of Onset  . Adopted: Yes  . Family history unknown: Yes   Social History   Social History  . Marital status: Married    Spouse name: N/A  . Number of children: N/A  . Years of education: N/A   Social History Main Topics  . Smoking status: Former Research scientist (life sciences)  . Smokeless tobacco: Former Systems developer  . Alcohol use 3.0 oz/week    5 Glasses of wine per week     Comment: socially  . Drug use: No  . Sexual activity: No   Other Topics Concern  . None   Social History Narrative  . None    Review of Systems  All other systems reviewed and are negative.      Objective:   Physical Exam  Constitutional: She is oriented to person, place, and time. She appears well-developed and well-nourished. No distress.  HENT:  Head:  Normocephalic and atraumatic.  Right Ear: Tympanic membrane, external ear and ear canal normal.  Left Ear: Tympanic membrane, external ear and ear canal normal.  Nose: Nose normal. No mucosal edema or rhinorrhea.  Mouth/Throat: Uvula is midline, oropharynx is clear and moist and mucous membranes are normal. No posterior oropharyngeal erythema.  Eyes: Conjunctivae and EOM are normal. Pupils are equal, round, and reactive to light. Right eye exhibits no discharge. Left eye exhibits no discharge. No scleral icterus.  Neck: Normal range of motion. Neck supple. No thyromegaly present.  Cardiovascular: Normal rate, regular rhythm, normal heart sounds and intact distal pulses.   Pulmonary/Chest: Effort normal and breath  sounds normal. No respiratory distress.  Abdominal: Soft. Bowel sounds are normal. There is no tenderness.  Genitourinary: No breast swelling, tenderness, discharge or bleeding.  Musculoskeletal: She exhibits no edema.  Lymphadenopathy:    She has no cervical adenopathy.  Neurological: She is alert and oriented to person, place, and time. She has normal reflexes.  Skin: Skin is warm and dry. She is not diaphoretic. No erythema.  Psychiatric: She has a normal mood and affect. Her behavior is normal.   BP 120/78 (BP Location: Left Arm, Patient Position: Sitting, Cuff Size: Normal)   Pulse 78   Temp 98.1 F (36.7 C) (Oral)   Resp 16   Ht 5' 2.25" (1.581 m)   Wt 143 lb 3.2 oz (65 kg)   SpO2 96%   BMI 25.98 kg/m    Hep C neg, HIV negative 3/20 a1c 5.2     Assessment & Plan:   sebacous cyst - could RTC to have removed if she would like but she declines due to concern about the size of the scar Brittle nails - try biotin and listerine soaks (the latter to get rid of any developing fungal infxn.  1. Annual physical exam - pt declines any lab work today - her life ins did a full panel on her in March and she has brought results with her which were normal - scanned into chart  2. Screening for breast cancer   3. Screening for cardiovascular, respiratory, and genitourinary diseases   4. Screening for cervical cancer   5. Screening for colorectal cancer - chooses to proceed with cologuard rather than colonoscopy  6. Screening for deficiency anemia   7. Screening for thyroid disorder   8. Routine screening for STI (sexually transmitted infection)   9. Vitamin D deficiency   10. Anxiety state - refilled chronic valium, dose stable.  Just taking valium 2.5mg  qd usu  11. Chronic constipation   12. Transaminitis   13. Hyperlipidemia   14. Leukopenia     Orders Placed This Encounter  Procedures  . Lipid panel    Order Specific Question:   Has the patient fasted?    Answer:   Yes  .  Comprehensive metabolic panel    Order Specific Question:   Has the patient fasted?    Answer:   Yes  . HIV antibody  . CBC with Differential/Platelet  . TSH  . Cologuard  . POCT urinalysis dipstick    Meds ordered this encounter  Medications  . diazepam (VALIUM) 5 MG tablet    Sig: Take 1 tablet (5 mg total) by mouth every 12 (twelve) hours as needed.    Dispense:  60 tablet    Refill:  5    [name], M.D.  [facility] [address] [place], [place] [address] [phone]  [phone] phone [phone] fax  04/13/16 6:33 PM

## 2016-04-24 ENCOUNTER — Ambulatory Visit (INDEPENDENT_AMBULATORY_CARE_PROVIDER_SITE_OTHER): Payer: Commercial Managed Care - HMO | Admitting: Physician Assistant

## 2016-04-24 VITALS — BP 130/88 | HR 90 | Temp 97.9°F | Resp 16 | Ht 62.25 in | Wt 144.0 lb

## 2016-04-24 DIAGNOSIS — T148XXA Other injury of unspecified body region, initial encounter: Secondary | ICD-10-CM

## 2016-04-24 DIAGNOSIS — S60812D Abrasion of left wrist, subsequent encounter: Secondary | ICD-10-CM | POA: Diagnosis not present

## 2016-04-24 DIAGNOSIS — T148 Other injury of unspecified body region: Secondary | ICD-10-CM

## 2016-04-24 DIAGNOSIS — Z23 Encounter for immunization: Secondary | ICD-10-CM

## 2016-04-24 DIAGNOSIS — M25532 Pain in left wrist: Secondary | ICD-10-CM | POA: Diagnosis not present

## 2016-04-24 NOTE — Patient Instructions (Addendum)
 Laceration Care, Adult A laceration is a cut that goes through all of the layers of the skin and into the tissue that is right under the skin. Some lacerations heal on their own. Others need to be closed with stitches (sutures), staples, skin adhesive strips, or skin glue. Proper laceration care minimizes the risk of infection and helps the laceration to heal better. HOW TO CARE FOR YOUR LACERATION If sutures or staples were used:  Keep the wound clean and dry.  If you were given a bandage (dressing), you should change it at least one time per day or as told by your health care provider. You should also change it if it becomes wet or dirty.  Keep the wound completely dry for the first 24 hours or as told by your health care provider. After that time, you may shower or bathe. However, make sure that the wound is not soaked in water until after the sutures or staples have been removed.  Clean the wound one time each day or as told by your health care provider:  Wash the wound with soap and water.  Rinse the wound with water to remove all soap.  Pat the wound dry with a clean towel. Do not rub the wound.  After cleaning the wound, apply a thin layer of antibiotic ointmentas told by your health care provider. This will help to prevent infection and keep the dressing from sticking to the wound.  Have the sutures or staples removed as told by your health care provider.   General Instructions  Take over-the-counter and prescription medicines only as told by your health care provider.  If you were prescribed an antibiotic medicine or ointment, take or apply it as told by your doctor. Do not stop using it even if your condition improves.  To help prevent scarring, make sure to cover your wound with sunscreen whenever you are outside after stitches are removed, after adhesive strips are removed, or when glue remains in place and the wound is healed. Make sure to wear a sunscreen of at least  30 SPF.  Do not scratch or pick at the wound.  Keep all follow-up visits as told by your health care provider. This is important.  Check your wound every day for signs of infection. Watch for:  Redness, swelling, or pain.  Fluid, blood, or pus.  Raise (elevate) the injured area above the level of your heart while you are sitting or lying down, if possible. SEEK MEDICAL CARE IF:  You received a tetanus shot and you have swelling, severe pain, redness, or bleeding at the injection site.  You have a fever.  A wound that was closed breaks open.  You notice a bad smell coming from your wound or your dressing.  You notice something coming out of the wound, such as wood or glass.  Your pain is not controlled with medicine.  You have increased redness, swelling, or pain at the site of your wound.  You have fluid, blood, or pus coming from your wound.  You notice a change in the color of your skin near your wound.  You need to change the dressing frequently due to fluid, blood, or pus draining from the wound.  You develop a new rash.  You develop numbness around the wound. SEEK IMMEDIATE MEDICAL CARE IF:  You develop severe swelling around the wound.  Your pain suddenly increases and is severe.  You develop painful lumps near the wound or on skin that   is anywhere on your body.  You have a red streak going away from your wound.  The wound is on your hand or foot and you cannot properly move a finger or toe.  The wound is on your hand or foot and you notice that your fingers or toes look pale or bluish.   This information is not intended to replace advice given to you by your health care provider. Make sure you discuss any questions you have with your health care provider.   Document Released: 09/01/2005 Document Revised: 01/16/2015 Document Reviewed: 08/28/2014 Elsevier Interactive Patient Education [employer].   Thank you for coming in today. I hope you feel  we met your needs.  Feel free to call [facility] if you have any questions or further requests.  Please consider signing up for MyChart if you do not already have it, as this is a great way to communicate with me.  Best,  Whitney McVey, PA-C  IF you received an x-ray today, you will receive an invoice from [facility]. Please contact [facility] at [phone] with questions or concerns regarding your invoice.   IF you received labwork today, you will receive an invoice from [employer]. Please contact [facility] at [phone] with questions or concerns regarding your invoice.   Our billing staff will not be able to assist you with questions regarding bills from these companies.  You will be contacted with the lab results as soon as they are available. The fastest way to get your results is to activate your My Chart account. Instructions are located on the last page of this paperwork. If you have not heard from [place] regarding the results in 2 weeks, please contact this office.

## 2016-04-24 NOTE — Progress Notes (Signed)
Angela Graves  MRN: [id number] DOB: 25-Apr-1956  PCP: No PCP Per Patient  Subjective:  Pt presents to clinic for left wrist pain. Two days ago she was carrying a box to her attic when a spring-loaded door slammed into her wrist, her bracelet scratched her arm and now her wrist and hand are bruised.  She is currently moving homes and has been helping with the move the past two days.  She is here today "because my family made me".  She has kept the wound clean and covered with a bandage, used no pain medication, no elevation. Has used ice a few times. Reports no decrease in strength or range of motion.  Review of Systems  Constitutional: Negative for chills, fatigue and fever.  Cardiovascular: Negative.   Musculoskeletal: Positive for joint swelling (worsening swelling around fingers/hand). Negative for arthralgias.  Skin: Positive for color change (bruising) and wound. Negative for pallor.  Neurological: Negative for dizziness, syncope, weakness, light-headedness and numbness.    There are no active problems to display for this patient.   Current Outpatient Prescriptions on File Prior to Visit  Medication Sig Dispense Refill  . diazepam (VALIUM) 5 MG tablet Take 1 tablet (5 mg total) by mouth every 12 (twelve) hours as needed. 60 tablet 5  . promethazine (PHENERGAN) 25 MG tablet Take 1 tablet (25 mg total) by mouth every 8 (eight) hours as needed for nausea. 30 tablet 3   No current facility-administered medications on file prior to visit.     Allergies  Allergen Reactions  . Codeine Itching  . Morphine And Related Other (See Comments)    Hyperactive    Objective:  BP 130/88 (BP Location: Right Arm, Patient Position: Sitting, Cuff Size: Normal)   Pulse 90   Temp 97.9 F (36.6 C) (Oral)   Resp 16   Ht 5' 2.25" (1.581 m)   Wt 144 lb (65.3 kg)   SpO2 98%   BMI 26.13 kg/m   Physical Exam  Constitutional: She is oriented to person, place, and time and well-developed,  well-nourished, and in no distress.  Musculoskeletal:       Left forearm: She exhibits tenderness (Minor TTP distal to laceration. ), swelling (left palm and fingers. ) and laceration (1x2 cm abrasion posterior proximal wrist). She exhibits no bony tenderness and no deformity.  Pulses intact, cap refill < 3 seconds.  No decreased ROM. Minimal pain elicited with flexion and extension against resistance. Grip strength 5/5.  Bruising around wrist, hand and fingers typical for 2 days post-injury.   Neurological: She is alert and oriented to person, place, and time. Gait normal.  Skin: Skin is warm and dry.  Psychiatric: Mood, memory, affect and judgment normal.  Vitals reviewed.   Assessment and Plan :  1. Abrasion - Tdap vaccine greater than or equal to 7yo IM - Apply dressing  2. Need for Tdap vaccination  3. Wrist pain, acute, left - Supportive care. Patient education about at-home care with ice, elevation and OTC pain control. Advised to return to clinic should she experience an increase in her pain level/swelling/numbness or tingling.  -There is no indication at this time for a wrist x-ray. She is showing no decrease in ROM, strength or sensation. There is no bony tenderness on physical exam. This was discussed with patient and she feels good about not receiving an xray at this time.    Mercer Pod, PA-C  [facility] 04/24/2016 5:10  PM

## 2016-05-07 LAB — COLOGUARD
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
## 2016-09-02 ENCOUNTER — Telehealth: Payer: Self-pay

## 2016-09-02 NOTE — Telephone Encounter (Signed)
Angela Graves - Pt said you ordered a color guard test in June on July of last year.  She never received any results.  Please call [phone]

## 2016-09-02 NOTE — Telephone Encounter (Signed)
I see order but not results? Please advise

## 2016-09-02 NOTE — Telephone Encounter (Signed)
It was negative.    This is a new test - I assumed that the cologuard sent the results to people but just figuring out that didn't happen. Results were sent to abstract then hopefully scanned it.

## 2016-09-05 NOTE — Telephone Encounter (Signed)
Patient advised and health maintanence updated

## 2016-11-04 ENCOUNTER — Other Ambulatory Visit: Payer: Self-pay | Admitting: Family Medicine

## 2016-11-04 NOTE — Telephone Encounter (Signed)
Called to [facility] 

## 2017-01-06 ENCOUNTER — Other Ambulatory Visit: Payer: Self-pay | Admitting: Family Medicine

## 2017-01-12 ENCOUNTER — Encounter: Payer: Self-pay | Admitting: Family Medicine

## 2017-01-12 ENCOUNTER — Ambulatory Visit (INDEPENDENT_AMBULATORY_CARE_PROVIDER_SITE_OTHER): Payer: BLUE CROSS/BLUE SHIELD | Admitting: Family Medicine

## 2017-01-12 VITALS — BP 147/83 | HR 79 | Temp 97.0°F | Resp 16 | Ht 62.0 in | Wt 152.0 lb

## 2017-01-12 DIAGNOSIS — R21 Rash and other nonspecific skin eruption: Secondary | ICD-10-CM

## 2017-01-12 DIAGNOSIS — E7800 Pure hypercholesterolemia, unspecified: Secondary | ICD-10-CM | POA: Diagnosis not present

## 2017-01-12 DIAGNOSIS — B354 Tinea corporis: Secondary | ICD-10-CM | POA: Diagnosis not present

## 2017-01-12 DIAGNOSIS — R030 Elevated blood-pressure reading, without diagnosis of hypertension: Secondary | ICD-10-CM

## 2017-01-12 DIAGNOSIS — D72819 Decreased white blood cell count, unspecified: Secondary | ICD-10-CM

## 2017-01-12 DIAGNOSIS — R740 Nonspecific elevation of levels of transaminase and lactic acid dehydrogenase [LDH]: Secondary | ICD-10-CM | POA: Diagnosis not present

## 2017-01-12 DIAGNOSIS — F411 Generalized anxiety disorder: Secondary | ICD-10-CM

## 2017-01-12 DIAGNOSIS — R7401 Elevation of levels of liver transaminase levels: Secondary | ICD-10-CM

## 2017-01-12 DIAGNOSIS — E559 Vitamin D deficiency, unspecified: Secondary | ICD-10-CM

## 2017-01-12 MED ORDER — CLOTRIMAZOLE-BETAMETHASONE 1-0.05 % EX CREA: 1.0000 "application " | TOPICAL_CREAM | Freq: Two times a day (BID) | CUTANEOUS | 0 refills | Status: DC

## 2017-01-12 MED ORDER — DIAZEPAM 5 MG PO TABS: 5.0000 mg | ORAL_TABLET | Freq: Two times a day (BID) | ORAL | 5 refills | Status: DC | PRN

## 2017-01-12 NOTE — Patient Instructions (Addendum)
IF you received an x-ray today, you will receive an invoice from [facility]. Please contact [facility] at [phone] with questions or concerns regarding your invoice.   IF you received labwork today, you will receive an invoice from [facility]. Please contact [facility] at [phone] with questions or concerns regarding your invoice.   Our billing staff will not be able to assist you with questions regarding bills from these companies.  You will be contacted with the lab results as soon as they are available. The fastest way to get your results is to activate your My Chart account. Instructions are located on the last page of this paperwork. If you have not heard from [place] regarding the results in 2 weeks, please contact this office.      Body Ringworm Body ringworm is an infection of the skin that often causes a ring-shaped rash. Body ringworm can affect any part of your skin. It can spread easily to others. Body ringworm is also called tinea corporis. What are the causes? This condition is caused by funguses called dermatophytes. The condition develops when these funguses grow out of control on the skin. You can get this condition if you touch a person or animal that has it. You can also get it if you share clothing, bedding, towels, or any other object with an infected person or pet. What increases the risk? This condition is more likely to develop in:  Athletes who often make skin-to-skin contact with other athletes, such as wrestlers.  People who share equipment and mats.  People with a weakened immune system. What are the signs or symptoms? Symptoms of this condition include:  Itchy, raised red spots and bumps.  Red scaly patches.  A ring-shaped rash. The rash may have:  A clear center.  Scales or red bumps at its center.  Redness near its borders.  Dry and scaly skin on or around it. How is this diagnosed? This condition can usually be  diagnosed with a skin exam. A skin scraping may be taken from the affected area and examined under a microscope to see if the fungus is present. How is this treated? This condition may be treated with:  An antifungal cream or ointment.  An antifungal shampoo.  Antifungal medicines. These may be prescribed if your ringworm is severe, keeps coming back, or lasts a long time. Follow these instructions at home:  Take over-the-counter and prescription medicines only as told by your health care provider.  If you were given an antifungal cream or ointment:  Use it as told by your health care provider.  Wash the infected area and dry it completely before applying the cream or ointment.  If you were given an antifungal shampoo:  Use it as told by your health care provider.  Leave the shampoo on your body for 3-5 minutes before rinsing.  While you have a rash:  Wear loose clothing to stop clothes from rubbing and irritating it.  Wash or change your bed sheets every night.  If your pet has the same infection, take your pet to see a Animal nutritionist. How is this prevented?  Practice good hygiene.  Wear sandals or shoes in public places and showers.  Do not share personal items with others.  Avoid touching red patches of skin on other people.  Avoid touching pets that have bald spots.  If you touch an animal that has a bald spot, wash your hands. Contact a health care provider if:  Your rash  continues to spread after 7 days of treatment.  Your rash is not gone in 4 weeks.  The area around your rash gets red, warm, tender, and swollen. This information is not intended to replace advice given to you by your health care provider. Make sure you discuss any questions you have with your health care provider. Document Released: 08/29/2000 Document Revised: 02/07/2016 Document Reviewed: 06/28/2015 Elsevier Interactive Patient Education  2017 [employer] Inc[employer]  Hypertension Hypertension,  commonly called high blood pressure, is when the force of blood pumping through the arteries is too strong. The arteries are the blood vessels that carry blood from the heart throughout the body. Hypertension forces the heart to work harder to pump blood and may cause arteries to become narrow or stiff. Having untreated or uncontrolled hypertension can cause heart attacks, strokes, kidney disease, and other problems. A blood pressure reading consists of a higher number over a lower number. Ideally, your blood pressure should be below 120/80. The first ("top") number is called the systolic pressure. It is a measure of the pressure in your arteries as your heart beats. The second ("bottom") number is called the diastolic pressure. It is a measure of the pressure in your arteries as the heart relaxes. What are the causes? The cause of this condition is not known. What increases the risk? Some risk factors for high blood pressure are under your control. Others are not. Factors you can change   Smoking.  Having type 2 diabetes mellitus, high cholesterol, or both.  Not getting enough exercise or physical activity.  Being overweight.  Having too much fat, sugar, calories, or salt (sodium) in your diet.  Drinking too much alcohol. Factors that are difficult or impossible to change   Having chronic kidney disease.  Having a family history of high blood pressure.  Age. Risk increases with age.  Race. You may be at higher risk if you are African-American.  Gender. Men are at higher risk than women before age 6. After age 56, women are at higher risk than men.  Having obstructive sleep apnea.  Stress. What are the signs or symptoms? Extremely high blood pressure (hypertensive crisis) may cause:  Headache.  Anxiety.  Shortness of breath.  Nosebleed.  Nausea and vomiting.  Severe chest pain.  Jerky movements you cannot control (seizures). How is this diagnosed? This condition is  diagnosed by measuring your blood pressure while you are seated, with your arm resting on a surface. The cuff of the blood pressure monitor will be placed directly against the skin of your upper arm at the level of your heart. It should be measured at least twice using the same arm. Certain conditions can cause a difference in blood pressure between your right and left arms. Certain factors can cause blood pressure readings to be lower or higher than normal (elevated) for a short period of time:  When your blood pressure is higher when you are in a health care provider's office than when you are at home, this is called white coat hypertension. Most people with this condition do not need medicines.  When your blood pressure is higher at home than when you are in a health care provider's office, this is called masked hypertension. Most people with this condition may need medicines to control blood pressure. If you have a high blood pressure reading during one visit or you have normal blood pressure with other risk factors:  You may be asked to return on a different day to  have your blood pressure checked again.  You may be asked to monitor your blood pressure at home for 1 week or longer. If you are diagnosed with hypertension, you may have other blood or imaging tests to help your health care provider understand your overall risk for other conditions. How is this treated? This condition is treated by making healthy lifestyle changes, such as eating healthy foods, exercising more, and reducing your alcohol intake. Your health care provider may prescribe medicine if lifestyle changes are not enough to get your blood pressure under control, and if:  Your systolic blood pressure is above 130.  Your diastolic blood pressure is above 80. Your personal target blood pressure may vary depending on your medical conditions, your age, and other factors. Follow these instructions at home: Eating and drinking    Eat a diet that is high in fiber and potassium, and low in sodium, added sugar, and fat. An example eating plan is called the DASH (Dietary Approaches to Stop Hypertension) diet. To eat this way:  Eat plenty of fresh fruits and vegetables. Try to fill half of your plate at each meal with fruits and vegetables.  Eat whole grains, such as whole wheat pasta, brown rice, or whole grain bread. Fill about one quarter of your plate with whole grains.  Eat or drink low-fat dairy products, such as skim milk or low-fat yogurt.  Avoid fatty cuts of meat, processed or cured meats, and poultry with skin. Fill about one quarter of your plate with lean proteins, such as fish, chicken without skin, beans, eggs, and tofu.  Avoid premade and processed foods. These tend to be higher in sodium, added sugar, and fat.  Reduce your daily sodium intake. Most people with hypertension should eat less than 1,500 mg of sodium a day.  Limit alcohol intake to no more than 1 drink a day for nonpregnant women and 2 drinks a day for men. One drink equals 12 oz of beer, 5 oz of wine, or 1 oz of hard liquor. Lifestyle   Work with your health care provider to maintain a healthy body weight or to lose weight. Ask what an ideal weight is for you.  Get at least 30 minutes of exercise that causes your heart to beat faster (aerobic exercise) most days of the week. Activities may include walking, swimming, or biking.  Include exercise to strengthen your muscles (resistance exercise), such as pilates or lifting weights, as part of your weekly exercise routine. Try to do these types of exercises for 30 minutes at least 3 days a week.  Do not use any products that contain nicotine or tobacco, such as cigarettes and e-cigarettes. If you need help quitting, ask your health care provider.  Monitor your blood pressure at home as told by your health care provider.  Keep all follow-up visits as told by your health care provider. This  is important. Medicines   Take over-the-counter and prescription medicines only as told by your health care provider. Follow directions carefully. Blood pressure medicines must be taken as prescribed.  Do not skip doses of blood pressure medicine. Doing this puts you at risk for problems and can make the medicine less effective.  Ask your health care provider about side effects or reactions to medicines that you should watch for. Contact a health care provider if:  You think you are having a reaction to a medicine you are taking.  You have headaches that keep coming back (recurring).  You feel dizzy.  You have swelling in your ankles.  You have trouble with your vision. Get help right away if:  You develop a severe headache or confusion.  You have unusual weakness or numbness.  You feel faint.  You have severe pain in your chest or abdomen.  You vomit repeatedly.  You have trouble breathing. Summary  Hypertension is when the force of blood pumping through your arteries is too strong. If this condition is not controlled, it may put you at risk for serious complications.  Your personal target blood pressure may vary depending on your medical conditions, your age, and other factors. For most people, a normal blood pressure is less than 120/80.  Hypertension is treated with lifestyle changes, medicines, or a combination of both. Lifestyle changes include weight loss, eating a healthy, low-sodium diet, exercising more, and limiting alcohol. This information is not intended to replace advice given to you by your health care provider. Make sure you discuss any questions you have with your health care provider. Document Released: 09/01/2005 Document Revised: 07/30/2016 Document Reviewed: 07/30/2016 Elsevier Interactive Patient Education  2017 [employer].

## 2017-01-12 NOTE — Progress Notes (Signed)
Subjective:    Patient ID: Angela Graves, female    DOB: [birth year], 61 y.o.   MRN: [id number] Chief Complaint  Patient presents with  . Rash    under arm/ x 10 days  . Medication Refill    valium    HPI  She has stock-piled her valium so she only takes 1/2 tab a day. Getting 60 tabs/mo so a 6 mos supply should last her 2 years. . . . .   Going to [place] and going to eat meat and cheese in 10d and then returning sev mos after that.  She has eaten today.  Taken 1000u/d ' bp 135 - refuses meds - plans to walk more  10d tried otc lamisil, athlete's, monistat, vics vaporub.   Very itchy. Nothing worsening. Did change deodorant   Past Medical History:  Diagnosis Date  . Migraine    Past Surgical History:  Procedure Laterality Date  . ABDOMINAL HYSTERECTOMY    . CHOLECYSTECTOMY    . JOINT REPLACEMENT     Current Outpatient Prescriptions on File Prior to Visit  Medication Sig Dispense Refill  . promethazine (PHENERGAN) 25 MG tablet Take 1 tablet (25 mg total) by mouth every 8 (eight) hours as needed for nausea. 30 tablet 3   No current facility-administered medications on file prior to visit.    Allergies  Allergen Reactions  . Codeine Itching  . Morphine And Related Other (See Comments)    Hyperactive   Family History  Problem Relation Age of Onset  . Adopted: Yes  . Family history unknown: Yes   Social History   Social History  . Marital status: Married    Spouse name: N/A  . Number of children: N/A  . Years of education: N/A   Social History Main Topics  . Smoking status: Former Research scientist (life sciences)  . Smokeless tobacco: Former Systems developer  . Alcohol use 3.0 oz/week    5 Glasses of wine per week     Comment: socially  . Drug use: No  . Sexual activity: No   Other Topics Concern  . None   Social History Narrative  . None   Depression screen [facility] 2/9 01/12/2017 04/24/2016 07/11/2015 02/16/2015  Decreased Interest 0 0 0 0  Down, Depressed, Hopeless 0 0 0 0  PHQ -  2 Score 0 0 0 0    Review of Systems See hpi    Objective:   Physical Exam  Constitutional: She is oriented to person, place, and time. She appears well-developed and well-nourished. No distress.  HENT:  Head: Normocephalic and atraumatic.  Right Ear: External ear normal.  Left Ear: External ear normal.  Eyes: Conjunctivae are normal. No scleral icterus.  Neck: Normal range of motion. Neck supple. No thyromegaly present.  Cardiovascular: Normal rate, regular rhythm, normal heart sounds and intact distal pulses.   Pulmonary/Chest: Effort normal and breath sounds normal. No respiratory distress.  Musculoskeletal: She exhibits no edema.  Lymphadenopathy:    She has no cervical adenopathy.  Neurological: She is alert and oriented to person, place, and time.  Skin: Skin is warm and dry. She is not diaphoretic. No erythema.  Psychiatric: She has a normal mood and affect. Her behavior is normal.   BP (!) 147/83   Pulse 79   Temp 97 F (36.1 C) (Oral)   Resp 16   Ht 5\' 2"  (1.575 m)   Wt 152 lb (68.9 kg)   SpO2 96%   BMI 27.80 kg/m  138/82    Results for orders placed or performed in visit on 04/10/16  Cologuard  Result Value Ref Range   Cologuard      Assessment & Plan:  Pt is due for her CPE in 3 mos.  Future orders for lipid and chol leukopenia so cbc vit d 1. Rash   2. Tinea corporis   3. Elevated blood pressure reading   4. Pure hypercholesterolemia   5. Transaminitis   6. Vitamin D deficiency   7. Leukopenia, unspecified type   8. Anxiety state     Orders Placed This Encounter  Procedures  . Care order/instruction:    AVS printed - let patient go!  Marland Kitchen POCT Skin KOH    Meds ordered this encounter  Medications  . diazepam (VALIUM) 5 MG tablet    Sig: Take 1 tablet (5 mg total) by mouth every 12 (twelve) hours as needed for anxiety.    Dispense:  60 tablet    Refill:  5  . clotrimazole-betamethasone (LOTRISONE) cream    Sig: Apply 1 application topically  2 (two) times daily.    Dispense:  45 g    Refill:  0     [name], M.D.  [facility] at [facility] [address] [place], [place] [address] [phone] phone [phone] fax  01/27/17 3:32 AM

## 2017-01-16 ENCOUNTER — Other Ambulatory Visit: Payer: BLUE CROSS/BLUE SHIELD | Admitting: Family Medicine

## 2017-01-16 DIAGNOSIS — R7401 Elevation of levels of liver transaminase levels: Secondary | ICD-10-CM

## 2017-01-16 DIAGNOSIS — Z13220 Encounter for screening for lipoid disorders: Secondary | ICD-10-CM

## 2017-01-16 DIAGNOSIS — D72819 Decreased white blood cell count, unspecified: Secondary | ICD-10-CM

## 2017-01-16 DIAGNOSIS — R740 Nonspecific elevation of levels of transaminase and lactic acid dehydrogenase [LDH]: Secondary | ICD-10-CM

## 2017-01-16 DIAGNOSIS — E559 Vitamin D deficiency, unspecified: Secondary | ICD-10-CM

## 2017-01-16 DIAGNOSIS — E785 Hyperlipidemia, unspecified: Secondary | ICD-10-CM

## 2017-01-16 NOTE — Progress Notes (Signed)
Here for labs , see 01/12/17 note

## 2017-01-17 LAB — COMPREHENSIVE METABOLIC PANEL
ALT: 55 IU/L — ABNORMAL HIGH (ref 0–32)
AST: 35 IU/L (ref 0–40)
Albumin/Globulin Ratio: 2 (ref 1.2–2.2)
Albumin: 4.3 g/dL (ref 3.6–4.8)
Alkaline Phosphatase: 85 IU/L (ref 39–117)
BUN/Creatinine Ratio: 27 (ref 12–28)
BUN: 18 mg/dL (ref 8–27)
Bilirubin Total: 0.6 mg/dL (ref 0.0–1.2)
CO2: 26 mmol/L (ref 18–29)
Calcium: 9.1 mg/dL (ref 8.7–10.3)
Chloride: 100 mmol/L (ref 96–106)
Creatinine, Ser: 0.67 mg/dL (ref 0.57–1.00)
GFR calc Af Amer: 109 mL/min/{1.73_m2} (ref >59)
GFR calc non Af Amer: 95 mL/min/{1.73_m2} (ref >59)
Globulin, Total: 2.2 g/dL (ref 1.5–4.5)
Glucose: 95 mg/dL (ref 65–99)
Potassium: 5.1 mmol/L (ref 3.5–5.2)
Sodium: 140 mmol/L (ref 134–144)
Total Protein: 6.5 g/dL (ref 6.0–8.5)

## 2017-01-17 LAB — CBC WITH DIFFERENTIAL/PLATELET
Basophils Absolute: 0 10*3/uL (ref 0.0–0.2)
Basos: 1 %
EOS (ABSOLUTE): 0.1 10*3/uL (ref 0.0–0.4)
Eos: 4 %
Hematocrit: 42.4 % (ref 34.0–46.6)
Hemoglobin: 14.2 g/dL (ref 11.1–15.9)
Immature Grans (Abs): 0 10*3/uL (ref 0.0–0.1)
Immature Granulocytes: 0 %
Lymphocytes Absolute: 1.3 10*3/uL (ref 0.7–3.1)
Lymphs: 43 %
MCH: 30.6 pg (ref 26.6–33.0)
MCHC: 33.5 g/dL (ref 31.5–35.7)
MCV: 91 fL (ref 79–97)
Monocytes Absolute: 0.2 10*3/uL (ref 0.1–0.9)
Monocytes: 7 %
Neutrophils Absolute: 1.4 10*3/uL (ref 1.4–7.0)
Neutrophils: 45 %
Platelets: 190 10*3/uL (ref 150–379)
RBC: 4.64 x10E6/uL (ref 3.77–5.28)
RDW: 13 % (ref 12.3–15.4)
WBC: 3.1 10*3/uL — ABNORMAL LOW (ref 3.4–10.8)

## 2017-01-17 LAB — LIPID PANEL
Chol/HDL Ratio: 5.2 ratio — ABNORMAL HIGH (ref 0.0–4.4)
Cholesterol, Total: 224 mg/dL — ABNORMAL HIGH (ref 100–199)
HDL: 43 mg/dL (ref >39)
LDL Calculated: 133 mg/dL — ABNORMAL HIGH (ref 0–99)
Triglycerides: 238 mg/dL — ABNORMAL HIGH (ref 0–149)
VLDL Cholesterol Cal: 48 mg/dL — ABNORMAL HIGH (ref 5–40)

## 2017-01-17 LAB — VITAMIN D 25 HYDROXY (VIT D DEFICIENCY, FRACTURES): Vit D, 25-Hydroxy: 31.5 ng/mL (ref 30.0–100.0)

## 2017-02-28 ENCOUNTER — Encounter: Payer: Self-pay | Admitting: Family Medicine

## 2017-04-29 DIAGNOSIS — F411 Generalized anxiety disorder: Secondary | ICD-10-CM | POA: Insufficient documentation

## 2017-04-29 DIAGNOSIS — E785 Hyperlipidemia, unspecified: Secondary | ICD-10-CM | POA: Insufficient documentation

## 2017-04-29 DIAGNOSIS — K760 Fatty (change of) liver, not elsewhere classified: Secondary | ICD-10-CM | POA: Insufficient documentation

## 2017-04-29 DIAGNOSIS — I10 Essential (primary) hypertension: Secondary | ICD-10-CM | POA: Insufficient documentation

## 2017-04-29 DIAGNOSIS — K5909 Other constipation: Secondary | ICD-10-CM | POA: Insufficient documentation

## 2017-04-29 DIAGNOSIS — E7800 Pure hypercholesterolemia, unspecified: Secondary | ICD-10-CM | POA: Insufficient documentation

## 2017-04-29 DIAGNOSIS — E559 Vitamin D deficiency, unspecified: Secondary | ICD-10-CM | POA: Insufficient documentation

## 2017-04-29 NOTE — Progress Notes (Signed)
Subjective:    Patient ID: Angela Graves, female    DOB: Sep 26, 1955, 61 y.o.   MRN: [id number] Chief Complaint  Patient presents with  . Hypertension    discuss bp    HPI  Angela Graves is a delightful 61 yo woman who is here today to discuss her blood pressure. I last saw her 4 mos prior immed prior to leaving for sev mo trip to [place]!!!  Elevated BP: Situational elevated blood pressure after driving. Is nml at home. Has never needed BP medication.  discussed poss starting med prior but pt wanted to increase exercise.  BP after dentist  Was 170/100.  A couple of other times BP 373S systolic. Has been exercising less due to weather but basically ready to just start medication. Does have some urinary frequency from drinking a lot of water in effort to be healthy. Nocturia of 1-2x.  Make sure to get   Former smoker: 61 yo to 61 yo x 3ppd but does not qualify for screening lung CT as stopped to long ago   HLD: Non-HDL 179 -> 218 -> 181.  ASCVD risk 2 yrs prior (02/2015 5.7%) now 6.6%.  Lab Results  Component Value Date   LDLCALC 133 (H) 01/16/2017   LDLCALC 169 (H) 07/11/2015   LDLCALC 138 (H) 02/16/2015   Vit D Def: taking 1000u/d Lab Results  Component Value Date   VD25OH 31.5 01/16/2017   VD25OH 35 07/11/2015   VD25OH 29 (L) 02/16/2015   Transaminitis/fatty liver: negative hepatitis panel 06/2015; RUQ liver US 07/2015 showed mildly increased hepatic echotexture likely fatty liver. S/p cholecystectomy Lab Results  Component Value Date   AST 35 01/16/2017   AST 37 (H) 07/11/2015   AST 36 02/16/2015   AST 21 08/30/2012   AST 27 03/14/2012   Lab Results  Component Value Date   ALT 55 (H) 01/16/2017   ALT 60 (H) 07/11/2015   ALT 57 (H) 02/16/2015   ALT 24 08/30/2012   ALT 25 03/14/2012   Leukopenia: present for > 5 yrs but gradually worsening - no prior h/o. Neg HIV test when getting life insurance last year 11/2015 - pt will drop off copy. CBC Latest Ref Rng & Units 01/16/2017  02/16/2015 08/30/2012  WBC 3.4 - 10.8 x10E3/uL 3.1(L) 3.7(L) 4.6  Hemoglobin 11.1 - 15.9 g/dL 14.2 14.4 15.0  Hematocrit 34.0 - 46.6 % 42.4 41.5 47.8  Platelets 150 - 379 x10E3/uL 190 200 -   Anxiety: triggers occ SHoB or substernal spasms poss in diaphragm with pain into both sides of her chest and back lasting for 30 min to a few hrs and occur every few wks.  stock-piled her valium so Angela Graves only takes 1/2 tab a day - worked well for many years. Getting 60 tabs/mo so a 6 mos supply should last her 2 years. . . . .   Chronic constipation: controlled by daily miralax  Migraines: Phenergan prn -flies 2-3x/yr which induces motion sickness and migraines  Grinds teeth at night and so dentist asks her to do a sleep study - can do a at home test for free. No daytime sleepiness. Has been tired lately in the morning but her dying dog is sleeping in her bed and then her back hurts to turn over in the night and then Angela Graves also has a chronic sinus post-nasal drip that Angela Graves wakes up feeling like Angela Graves drowning in the post-nasal drip.  Angela Graves is not doing anything for this than occ benadryl.  Past Medical History:  Diagnosis Date  . Migraine    Past Surgical History:  Procedure Laterality Date  . ABDOMINAL HYSTERECTOMY    . CHOLECYSTECTOMY    . JOINT REPLACEMENT     Current Outpatient Prescriptions on File Prior to Visit  Medication Sig Dispense Refill  . clotrimazole-betamethasone (LOTRISONE) cream Apply 1 application topically 2 (two) times daily. 45 g 0  . diazepam (VALIUM) 5 MG tablet Take 1 tablet (5 mg total) by mouth every 12 (twelve) hours as needed for anxiety. 60 tablet 5  . promethazine (PHENERGAN) 25 MG tablet Take 1 tablet (25 mg total) by mouth every 8 (eight) hours as needed for nausea. 30 tablet 3   No current facility-administered medications on file prior to visit.    Allergies  Allergen Reactions  . Codeine Itching  . Morphine And Related Other (See Comments)    Hyperactive   Family  History  Problem Relation Age of Onset  . Adopted: Yes  . Family history unknown: Yes   Social History   Social History  . Marital status: Married    Spouse name: N/A  . Number of children: N/A  . Years of education: N/A   Social History Main Topics  . Smoking status: Former Research scientist (life sciences)  . Smokeless tobacco: Former Systems developer  . Alcohol use 3.0 oz/week    5 Glasses of wine per week     Comment: socially  . Drug use: No  . Sexual activity: No   Other Topics Concern  . None   Social History Narrative  . None   Depression screen [facility] 2/9 04/30/2017 01/12/2017 04/24/2016 07/11/2015 02/16/2015  Decreased Interest 0 0 0 0 0  Down, Depressed, Hopeless 0 0 0 0 0  PHQ - 2 Score 0 0 0 0 0    Review of Systems See hpi    Objective:   Physical Exam  Constitutional: Angela Graves is oriented to person, place, and time. Angela Graves appears well-developed and well-nourished. No distress.  HENT:  Head: Normocephalic and atraumatic.  Right Ear: External ear normal.  Left Ear: External ear normal.  Eyes: Conjunctivae are normal. No scleral icterus.  Neck: Normal range of motion. Neck supple. No thyromegaly present.  Cardiovascular: Normal rate, regular rhythm, normal heart sounds and intact distal pulses.   Pulmonary/Chest: Effort normal and breath sounds normal. No respiratory distress.  Musculoskeletal: Angela Graves exhibits no edema.  Lymphadenopathy:    Angela Graves has no cervical adenopathy.  Neurological: Angela Graves is alert and oriented to person, place, and time.  Skin: Skin is warm and dry. Angela Graves is not diaphoretic. No erythema.  Psychiatric: Angela Graves has a normal mood and affect. Her behavior is normal.      BP (!) 148/85 (BP Location: Right Arm, Patient Position: Sitting, Cuff Size: Normal)   Pulse 74   Temp 97.6 F (36.4 C) (Oral)   Resp 16   Ht 5\' 2"  (1.575 m)   Wt 152 lb (68.9 kg)   SpO2 98%   BMI 27.80 kg/m   Assessment & Plan:  Due for CPE. - due for mammogram Cbc, cmp, peripheral smear, hiv - pt refuses latter  due to t - will do at next visit  1. Essential hypertension   2. Pure hypercholesterolemia   3. Transaminitis   4. Vitamin D deficiency   5. Leukopenia, unspecified type   6. Anxiety state   7. Chronic constipation   8. Fatty liver   9. Screening for breast cancer     Orders Placed  This Encounter  Procedures  . MM Digital Screening    Standing Status:   Future    Standing Expiration Date:   06/30/2018    Order Specific Question:   Reason for Exam (SYMPTOM  OR DIAGNOSIS REQUIRED)    Answer:   screening    Order Specific Question:   Preferred imaging location?    Answer:   [facility]    Meds ordered this encounter  Medications  . fluticasone (FLONASE) 50 MCG/ACT nasal spray    Sig: Place 2 sprays into both nostrils at bedtime.    Dispense:  16 g    Refill:  2  . hydrochlorothiazide (HYDRODIURIL) 25 MG tablet    Sig: Take 1 tablet (25 mg total) by mouth daily.    Dispense:  90 tablet    Refill:  0    [name], M.D.  Primary Care at [facility] [address] [place], [place] [address] [phone] phone [phone] fax  05/03/17 7:57 AM

## 2017-04-30 ENCOUNTER — Encounter: Payer: Self-pay | Admitting: Family Medicine

## 2017-04-30 ENCOUNTER — Ambulatory Visit (INDEPENDENT_AMBULATORY_CARE_PROVIDER_SITE_OTHER): Payer: BLUE CROSS/BLUE SHIELD | Admitting: Family Medicine

## 2017-04-30 VITALS — BP 155/83 | HR 72 | Temp 97.6°F | Resp 16 | Ht 62.0 in | Wt 152.0 lb

## 2017-04-30 DIAGNOSIS — R740 Nonspecific elevation of levels of transaminase and lactic acid dehydrogenase [LDH]: Secondary | ICD-10-CM

## 2017-04-30 DIAGNOSIS — I10 Essential (primary) hypertension: Secondary | ICD-10-CM

## 2017-04-30 DIAGNOSIS — F411 Generalized anxiety disorder: Secondary | ICD-10-CM | POA: Diagnosis not present

## 2017-04-30 DIAGNOSIS — E7800 Pure hypercholesterolemia, unspecified: Secondary | ICD-10-CM

## 2017-04-30 DIAGNOSIS — Z1231 Encounter for screening mammogram for malignant neoplasm of breast: Secondary | ICD-10-CM | POA: Diagnosis not present

## 2017-04-30 DIAGNOSIS — R7401 Elevation of levels of liver transaminase levels: Secondary | ICD-10-CM

## 2017-04-30 DIAGNOSIS — E559 Vitamin D deficiency, unspecified: Secondary | ICD-10-CM | POA: Diagnosis not present

## 2017-04-30 DIAGNOSIS — Z1239 Encounter for other screening for malignant neoplasm of breast: Secondary | ICD-10-CM

## 2017-04-30 DIAGNOSIS — D72819 Decreased white blood cell count, unspecified: Secondary | ICD-10-CM | POA: Diagnosis not present

## 2017-04-30 DIAGNOSIS — K5909 Other constipation: Secondary | ICD-10-CM

## 2017-04-30 DIAGNOSIS — K760 Fatty (change of) liver, not elsewhere classified: Secondary | ICD-10-CM

## 2017-04-30 MED ORDER — HYDROCHLOROTHIAZIDE 25 MG PO TABS: 25.0000 mg | ORAL_TABLET | Freq: Every day | ORAL | 0 refills | Status: DC

## 2017-04-30 MED ORDER — FLUTICASONE PROPIONATE 50 MCG/ACT NA SUSP: 2.0000 | Freq: Every day | NASAL | 2 refills | Status: DC

## 2017-04-30 NOTE — Patient Instructions (Addendum)
IF you received an x-ray today, you will receive an invoice from [facility]. Please contact [facility] at [phone] with questions or concerns regarding your invoice.   IF you received labwork today, you will receive an invoice from [facility]. Please contact [facility] at [phone] with questions or concerns regarding your invoice.   Our billing staff will not be able to assist you with questions regarding bills from these companies.  You will be contacted with the lab results as soon as they are available. The fastest way to get your results is to activate your My Chart account. Instructions are located on the last page of this paperwork. If you have not heard from [place] regarding the results in 2 weeks, please contact this office.    We recommend that you schedule a mammogram for breast cancer screening. Typically, you do not need a referral to do this. Please contact a local imaging center to schedule your mammogram.   [facility] ([place]) - [phone] or [phone]    [place] stands for "Dietary Approaches to Stop Hypertension." The DASH eating plan is a healthy eating plan that has been shown to reduce high blood pressure (hypertension). It may also reduce your risk for type 2 diabetes, heart disease, and stroke. The DASH eating plan may also help with weight loss. What are tips for following this plan? General guidelines  Avoid eating more than 2,300 mg (milligrams) of salt (sodium) a day. If you have hypertension, you may need to reduce your sodium intake to 1,500 mg a day.  Limit alcohol intake to no more than 1 drink a day for nonpregnant women and 2 drinks a day for men. One drink equals 12 oz of beer, 5 oz of wine, or 1 oz of hard liquor.  Work with your health care provider to maintain a healthy body weight or to lose weight. Ask what an ideal weight is for you.  Get at least 30 minutes of exercise that  causes your heart to beat faster (aerobic exercise) most days of the week. Activities may include walking, swimming, or biking.  Work with your health care provider or diet and nutrition specialist (dietitian) to adjust your eating plan to your individual calorie needs. Reading food labels  Check food labels for the amount of sodium per serving. Choose foods with less than 5 percent of the Daily Value of sodium. Generally, foods with less than 300 mg of sodium per serving fit into this eating plan.  To find whole grains, look for the word "whole" as the first word in the ingredient list. Shopping  Buy products labeled as "low-sodium" or "no salt added."  Buy fresh foods. Avoid canned foods and premade or frozen meals. Cooking  Avoid adding salt when cooking. Use salt-free seasonings or herbs instead of table salt or sea salt. Check with your health care provider or pharmacist before using salt substitutes.  Do not fry foods. Cook foods using healthy methods such as baking, boiling, grilling, and broiling instead.  Cook with heart-healthy oils, such as olive, canola, soybean, or sunflower oil. Meal planning   Eat a balanced diet that includes: ? 5 or more servings of fruits and vegetables each day. At each meal, try to fill half of your plate with fruits and vegetables. ? Up to 6-8 servings of whole grains each day. ? Less than 6 oz of lean meat, poultry, or fish each day. A 3-oz serving of meat  is about the same size as a deck of cards. One egg equals 1 oz. ? 2 servings of low-fat dairy each day. ? A serving of nuts, seeds, or beans 5 times each week. ? Heart-healthy fats. Healthy fats called Omega-3 fatty acids are found in foods such as flaxseeds and coldwater fish, like sardines, salmon, and mackerel.  Limit how much you eat of the following: ? Canned or prepackaged foods. ? Food that is high in trans fat, such as fried foods. ? Food that is high in saturated fat, such as fatty  meat. ? Sweets, desserts, sugary drinks, and other foods with added sugar. ? Full-fat dairy products.  Do not salt foods before eating.  Try to eat at least 2 vegetarian meals each week.  Eat more home-cooked food and less restaurant, buffet, and fast food.  When eating at a restaurant, ask that your food be prepared with less salt or no salt, if possible. What foods are recommended? The items listed may not be a complete list. Talk with your dietitian about what dietary choices are best for you. Grains Whole-grain or whole-wheat bread. Whole-grain or whole-wheat pasta. Brown rice. [place]. Bulgur. Whole-grain and low-sodium cereals. Pita bread. Low-fat, low-sodium crackers. Whole-wheat flour tortillas. Vegetables Fresh or frozen vegetables (raw, steamed, roasted, or grilled). Low-sodium or reduced-sodium tomato and vegetable juice. Low-sodium or reduced-sodium tomato sauce and tomato paste. Low-sodium or reduced-sodium canned vegetables. Fruits All fresh, dried, or frozen fruit. Canned fruit in natural juice (without added sugar). Meat and other protein foods Skinless chicken or [place]. Ground chicken or [place]. Pork with fat trimmed off. Fish and seafood. Egg whites. Dried beans, peas, or lentils. Unsalted nuts, nut butters, and seeds. Unsalted canned beans. Lean cuts of beef with fat trimmed off. Low-sodium, lean deli meat. Dairy Low-fat (1%) or fat-free (skim) milk. Fat-free, low-fat, or reduced-fat cheeses. Nonfat, low-sodium ricotta or cottage cheese. Low-fat or nonfat yogurt. Low-fat, low-sodium cheese. Fats and oils Soft margarine without trans fats. Vegetable oil. Low-fat, reduced-fat, or light mayonnaise and salad dressings (reduced-sodium). Canola, safflower, olive, soybean, and sunflower oils. Avocado. Seasoning and other foods Herbs. Spices. Seasoning mixes without salt. Unsalted popcorn and pretzels. Fat-free sweets. What foods are not recommended? The items listed  may not be a complete list. Talk with your dietitian about what dietary choices are best for you. Grains Baked goods made with fat, such as croissants, muffins, or some breads. Dry pasta or rice meal packs. Vegetables Creamed or fried vegetables. Vegetables in a cheese sauce. Regular canned vegetables (not low-sodium or reduced-sodium). Regular canned tomato sauce and paste (not low-sodium or reduced-sodium). Regular tomato and vegetable juice (not low-sodium or reduced-sodium). Angie Fava. Olives. Fruits Canned fruit in a light or heavy syrup. Fried fruit. Fruit in cream or butter sauce. Meat and other protein foods Fatty cuts of meat. Ribs. Fried meat. Berniece Salines. Sausage. Bologna and other processed lunch meats. Salami. Fatback. Hotdogs. Bratwurst. Salted nuts and seeds. Canned beans with added salt. Canned or smoked fish. Whole eggs or egg yolks. Chicken or [place] with skin. Dairy Whole or 2% milk, cream, and half-and-half. Whole or full-fat cream cheese. Whole-fat or sweetened yogurt. Full-fat cheese. Nondairy creamers. Whipped toppings. Processed cheese and cheese spreads. Fats and oils Butter. Stick margarine. Lard. Shortening. Ghee. Bacon fat. Tropical oils, such as coconut, palm kernel, or palm oil. Seasoning and other foods Salted popcorn and pretzels. Onion salt, garlic salt, seasoned salt, table salt, and sea salt. Worcestershire sauce. Tartar sauce. Barbecue sauce. Teriyaki sauce.  Soy sauce, including reduced-sodium. Steak sauce. Canned and packaged gravies. Fish sauce. Oyster sauce. Cocktail sauce. Horseradish that you find on the shelf. Ketchup. Mustard. Meat flavorings and tenderizers. Bouillon cubes. Hot sauce and Tabasco sauce. Premade or packaged marinades. Premade or packaged taco seasonings. Relishes. Regular salad dressings. Where to find more information:  National Heart, Lung, and [place]: https://wilson-eaton.com/  American Heart Association: www.heart.org Summary  The DASH  eating plan is a healthy eating plan that has been shown to reduce high blood pressure (hypertension). It may also reduce your risk for type 2 diabetes, heart disease, and stroke.  With the DASH eating plan, you should limit salt (sodium) intake to 2,300 mg a day. If you have hypertension, you may need to reduce your sodium intake to 1,500 mg a day.  When on the DASH eating plan, aim to eat more fresh fruits and vegetables, whole grains, lean proteins, low-fat dairy, and heart-healthy fats.  Work with your health care provider or diet and nutrition specialist (dietitian) to adjust your eating plan to your individual calorie needs. This information is not intended to replace advice given to you by your health care provider. Make sure you discuss any questions you have with your health care provider. Document Released: 08/21/2011 Document Revised: 08/25/2016 Document Reviewed: 08/25/2016 Elsevier Interactive Patient Education  2017 Elsevier Inc.  Hypertension Hypertension, commonly called high blood pressure, is when the force of blood pumping through the arteries is too strong. The arteries are the blood vessels that carry blood from the heart throughout the body. Hypertension forces the heart to work harder to pump blood and may cause arteries to become narrow or stiff. Having untreated or uncontrolled hypertension can cause heart attacks, strokes, kidney disease, and other problems. A blood pressure reading consists of a higher number over a lower number. Ideally, your blood pressure should be below 120/80. The first ("top") number is called the systolic pressure. It is a measure of the pressure in your arteries as your heart beats. The second ("bottom") number is called the diastolic pressure. It is a measure of the pressure in your arteries as the heart relaxes. What are the causes? The cause of this condition is not known. What increases the risk? Some risk factors for high blood pressure are  under your control. Others are not. Factors you can change  Smoking.  Having type 2 diabetes mellitus, high cholesterol, or both.  Not getting enough exercise or physical activity.  Being overweight.  Having too much fat, sugar, calories, or salt (sodium) in your diet.  Drinking too much alcohol. Factors that are difficult or impossible to change  Having chronic kidney disease.  Having a family history of high blood pressure.  Age. Risk increases with age.  Race. You may be at higher risk if you are African-American.  Gender. Men are at higher risk than women before age 7. After age 75, women are at higher risk than men.  Having obstructive sleep apnea.  Stress. What are the signs or symptoms? Extremely high blood pressure (hypertensive crisis) may cause:  Headache.  Anxiety.  Shortness of breath.  Nosebleed.  Nausea and vomiting.  Severe chest pain.  Jerky movements you cannot control (seizures).  How is this diagnosed? This condition is diagnosed by measuring your blood pressure while you are seated, with your arm resting on a surface. The cuff of the blood pressure monitor will be placed directly against the skin of your upper arm at the level of your heart.  It should be measured at least twice using the same arm. Certain conditions can cause a difference in blood pressure between your right and left arms. Certain factors can cause blood pressure readings to be lower or higher than normal (elevated) for a short period of time:  When your blood pressure is higher when you are in a health care provider's office than when you are at home, this is called white coat hypertension. Most people with this condition do not need medicines.  When your blood pressure is higher at home than when you are in a health care provider's office, this is called masked hypertension. Most people with this condition may need medicines to control blood pressure.  If you have a high  blood pressure reading during one visit or you have normal blood pressure with other risk factors:  You may be asked to return on a different day to have your blood pressure checked again.  You may be asked to monitor your blood pressure at home for 1 week or longer.  If you are diagnosed with hypertension, you may have other blood or imaging tests to help your health care provider understand your overall risk for other conditions. How is this treated? This condition is treated by making healthy lifestyle changes, such as eating healthy foods, exercising more, and reducing your alcohol intake. Your health care provider may prescribe medicine if lifestyle changes are not enough to get your blood pressure under control, and if:  Your systolic blood pressure is above 130.  Your diastolic blood pressure is above 80.  Your personal target blood pressure may vary depending on your medical conditions, your age, and other factors. Follow these instructions at home: Eating and drinking  Eat a diet that is high in fiber and potassium, and low in sodium, added sugar, and fat. An example eating plan is called the DASH (Dietary Approaches to Stop Hypertension) diet. To eat this way: ? Eat plenty of fresh fruits and vegetables. Try to fill half of your plate at each meal with fruits and vegetables. ? Eat whole grains, such as whole wheat pasta, brown rice, or whole grain bread. Fill about one quarter of your plate with whole grains. ? Eat or drink low-fat dairy products, such as skim milk or low-fat yogurt. ? Avoid fatty cuts of meat, processed or cured meats, and poultry with skin. Fill about one quarter of your plate with lean proteins, such as fish, chicken without skin, beans, eggs, and tofu. ? Avoid premade and processed foods. These tend to be higher in sodium, added sugar, and fat.  Reduce your daily sodium intake. Most people with hypertension should eat less than 1,500 mg of sodium a  day.  Limit alcohol intake to no more than 1 drink a day for nonpregnant women and 2 drinks a day for men. One drink equals 12 oz of beer, 5 oz of wine, or 1 oz of hard liquor. Lifestyle  Work with your health care provider to maintain a healthy body weight or to lose weight. Ask what an ideal weight is for you.  Get at least 30 minutes of exercise that causes your heart to beat faster (aerobic exercise) most days of the week. Activities may include walking, swimming, or biking.  Include exercise to strengthen your muscles (resistance exercise), such as pilates or lifting weights, as part of your weekly exercise routine. Try to do these types of exercises for 30 minutes at least 3 days a week.  Do not  use any products that contain nicotine or tobacco, such as cigarettes and e-cigarettes. If you need help quitting, ask your health care provider.  Monitor your blood pressure at home as told by your health care provider.  Keep all follow-up visits as told by your health care provider. This is important. Medicines  Take over-the-counter and prescription medicines only as told by your health care provider. Follow directions carefully. Blood pressure medicines must be taken as prescribed.  Do not skip doses of blood pressure medicine. Doing this puts you at risk for problems and can make the medicine less effective.  Ask your health care provider about side effects or reactions to medicines that you should watch for. Contact a health care provider if:  You think you are having a reaction to a medicine you are taking.  You have headaches that keep coming back (recurring).  You feel dizzy.  You have swelling in your ankles.  You have trouble with your vision. Get help right away if:  You develop a severe headache or confusion.  You have unusual weakness or numbness.  You feel faint.  You have severe pain in your chest or abdomen.  You vomit repeatedly.  You have trouble  breathing. Summary  Hypertension is when the force of blood pumping through your arteries is too strong. If this condition is not controlled, it may put you at risk for serious complications.  Your personal target blood pressure may vary depending on your medical conditions, your age, and other factors. For most people, a normal blood pressure is less than 120/80.  Hypertension is treated with lifestyle changes, medicines, or a combination of both. Lifestyle changes include weight loss, eating a healthy, low-sodium diet, exercising more, and limiting alcohol. This information is not intended to replace advice given to you by your health care provider. Make sure you discuss any questions you have with your health care provider. Document Released: 09/01/2005 Document Revised: 07/30/2016 Document Reviewed: 07/30/2016 Elsevier Interactive Patient Education  2018 [employer].  Potassium Content of Foods Potassium is a mineral found in many foods and drinks. It helps keep fluids and minerals balanced in your body and affects how steadily your heart beats. Potassium also helps control your blood pressure and keep your muscles and nervous system healthy. Certain health conditions and medicines may change the balance of potassium in your body. When this happens, you can help balance your level of potassium through the foods that you do or do not eat. Your health care provider or dietitian may recommend an amount of potassium that you should have each day. The following lists of foods provide the amount of potassium (in parentheses) per serving in each item. High in potassium The following foods and beverages have 200 mg or more of potassium per serving:  Apricots, 2 raw or 5 dry (200 mg).  Artichoke, 1 medium (345 mg).  Avocado, raw,  each (245 mg).  Banana, 1 medium (425 mg).  Beans, lima, or baked beans, canned,  cup (280 mg).  Beans, white, canned,  cup (595 mg).  Beef roast, 3 oz (320  mg).  Beef, ground, 3 oz (270 mg).  Beets, raw or cooked,  cup (260 mg).  Bran muffin, 2 oz (300 mg).  Broccoli,  cup (230 mg).  Brussels sprouts,  cup (250 mg).  Cantaloupe,  cup (215 mg).  Cereal, 100% bran,  cup (200-400 mg).  Cheeseburger, single, fast food, 1 each (225-400 mg).  Chicken, 3 oz (220 mg).  Clams, canned, 3 oz (535 mg).  Crab, 3 oz (225 mg).  Dates, 5 each (270 mg).  Dried beans and peas,  cup (300-475 mg).  Figs, dried, 2 each (260 mg).  Fish: halibut, tuna, cod, snapper, 3 oz (480 mg).  Fish: salmon, haddock, swordfish, perch, 3 oz (300 mg).  Fish, tuna, canned 3 oz (200 mg).  [place] fries, fast food, 3 oz (470 mg).  Granola with fruit and nuts,  cup (200 mg).  Grapefruit juice,  cup (200 mg).  Greens, beet,  cup (655 mg).  Honeydew melon,  cup (200 mg).  Kale, raw, 1 cup (300 mg).  Kiwi, 1 medium (240 mg).  Kohlrabi, rutabaga, parsnips,  cup (280 mg).  Lentils,  cup (365 mg).  Mango, 1 each (325 mg).  Milk, chocolate, 1 cup (420 mg).  Milk: nonfat, low-fat, whole, buttermilk, 1 cup (350-380 mg).  Molasses, 1 Tbsp (295 mg).  Mushrooms,  cup (280) mg.  Nectarine, 1 each (275 mg).  Nuts: almonds, peanuts, hazelnuts, [place], cashew, mixed, 1 oz (200 mg).  Nuts, pistachios, 1 oz (295 mg).  Orange, 1 each (240 mg).  Orange juice,  cup (235 mg).  Papaya, medium,  fruit (390 mg).  Peanut butter, chunky, 2 Tbsp (240 mg).  Peanut butter, smooth, 2 Tbsp (210 mg).  Pear, 1 medium (200 mg).  Pomegranate, 1 whole (400 mg).  Pomegranate juice,  cup (215 mg).  Pork, 3 oz (350 mg).  Potato chips, salted, 1 oz (465 mg).  Potato, baked with skin, 1 medium (925 mg).  Potatoes, boiled,  cup (255 mg).  Potatoes, mashed,  cup (330 mg).  Prune juice,  cup (370 mg).  Prunes, 5 each (305 mg).  Pudding, chocolate,  cup (230 mg).  Pumpkin, canned,  cup (250 mg).  Raisins, seedless,  cup (270  mg).  Seeds, sunflower or pumpkin, 1 oz (240 mg).  Soy milk, 1 cup (300 mg).  Spinach,  cup (420 mg).  Spinach, canned,  cup (370 mg).  Sweet potato, baked with skin, 1 medium (450 mg).  Swiss chard,  cup (480 mg).  Tomato or vegetable juice,  cup (275 mg).  Tomato sauce or puree,  cup (400-550 mg).  Tomato, raw, 1 medium (290 mg).  Tomatoes, canned,  cup (200-300 mg).  [place], 3 oz (250 mg).  Wheat germ, 1 oz (250 mg).  Winter squash,  cup (250 mg).  Yogurt, plain or fruited, 6 oz (260-435 mg).  Zucchini,  cup (220 mg).  Moderate in potassium The following foods and beverages have 50-200 mg of potassium per serving:  Apple, 1 each (150 mg).  Apple juice,  cup (150 mg).  Applesauce,  cup (90 mg).  Apricot nectar,  cup (140 mg).  Asparagus, small spears,  cup or 6 spears (155 mg).  Bagel, cinnamon raisin, 1 each (130 mg).  Bagel, egg or plain, 4 in., 1 each (70 mg).  Beans, green,  cup (90 mg).  Beans, yellow,  cup (190 mg).  Beer, regular, 12 oz (100 mg).  Beets, canned,  cup (125 mg).  Blackberries,  cup (115 mg).  Blueberries,  cup (60 mg).  Bread, whole wheat, 1 slice (70 mg).  Broccoli, raw,  cup (145 mg).  Cabbage,  cup (150 mg).  Carrots, cooked or raw,  cup (180 mg).  Cauliflower, raw,  cup (150 mg).  Celery, raw,  cup (155 mg).  Cereal, bran flakes, cup (120-150 mg).  Cheese, cottage,  cup (110 mg).  Cherries, 10 each (150 mg).  Chocolate, 1 oz bar (165 mg).  Coffee, brewed 6 oz (90 mg).  Corn,  cup or 1 ear (195 mg).  Cucumbers,  cup (80 mg).  Egg, large, 1 each (60 mg).  Eggplant,  cup (60 mg).  Endive, raw, cup (80 mg).  English muffin, 1 each (65 mg).  Fish, orange roughy, 3 oz (150 mg).  Frankfurter, beef or pork, 1 each (75 mg).  Fruit cocktail,  cup (115 mg).  Grape juice,  cup (170 mg).  Grapefruit,  fruit (175 mg).  Grapes,  cup (155 mg).  Greens: kale, turnip,  collard,  cup (110-150 mg).  Ice cream or frozen yogurt, chocolate,  cup (175 mg).  Ice cream or frozen yogurt, vanilla,  cup (120-150 mg).  Lemons, limes, 1 each (80 mg).  Lettuce, all types, 1 cup (100 mg).  Mixed vegetables,  cup (150 mg).  Mushrooms, raw,  cup (110 mg).  Nuts: walnuts, pecans, or macadamia, 1 oz (125 mg).  Oatmeal,  cup (80 mg).  Okra,  cup (110 mg).  Onions, raw,  cup (120 mg).  Peach, 1 each (185 mg).  Peaches, canned,  cup (120 mg).  Pears, canned,  cup (120 mg).  Peas, green, frozen,  cup (90 mg).  Peppers, green,  cup (130 mg).  Peppers, red,  cup (160 mg).  Pineapple juice,  cup (165 mg).  Pineapple, fresh or canned,  cup (100 mg).  Plums, 1 each (105 mg).  Pudding, vanilla,  cup (150 mg).  Raspberries,  cup (90 mg).  Rhubarb,  cup (115 mg).  Rice, wild,  cup (80 mg).  Shrimp, 3 oz (155 mg).  Spinach, raw, 1 cup (170 mg).  Strawberries,  cup (125 mg).  Summer squash  cup (175-200 mg).  Swiss chard, raw, 1 cup (135 mg).  Tangerines, 1 each (140 mg).  Tea, brewed, 6 oz (65 mg).  Turnips,  cup (140 mg).  Watermelon,  cup (85 mg).  Wine, red, table, 5 oz (180 mg).  Wine, white, table, 5 oz (100 mg).  Low in potassium The following foods and beverages have less than 50 mg of potassium per serving.  Bread, white, 1 slice (30 mg).  Carbonated beverages, 12 oz (less than 5 mg).  Cheese, 1 oz (20-30 mg).  Cranberries,  cup (45 mg).  Cranberry juice cocktail,  cup (20 mg).  Fats and oils, 1 Tbsp (less than 5 mg).  Hummus, 1 Tbsp (32 mg).  Nectar: papaya, mango, or pear,  cup (35 mg).  Rice, white or brown,  cup (50 mg).  Spaghetti or macaroni,  cup cooked (30 mg).  Tortilla, flour or corn, 1 each (50 mg).  Waffle, 4 in., 1 each (50 mg).  Water chestnuts,  cup (40 mg).  This information is not intended to replace advice given to you by your health care provider. Make sure  you discuss any questions you have with your health care provider. Document Released: 04/15/2005 Document Revised: 02/07/2016 Document Reviewed: 07/29/2013 Elsevier Interactive Patient Education  [employer].

## 2017-05-20 ENCOUNTER — Ambulatory Visit (INDEPENDENT_AMBULATORY_CARE_PROVIDER_SITE_OTHER): Payer: BLUE CROSS/BLUE SHIELD | Admitting: Family Medicine

## 2017-05-20 ENCOUNTER — Encounter: Payer: Self-pay | Admitting: Family Medicine

## 2017-05-20 VITALS — BP 150/85 | HR 88 | Temp 97.6°F | Resp 17 | Ht 62.5 in | Wt 154.0 lb

## 2017-05-20 DIAGNOSIS — R110 Nausea: Secondary | ICD-10-CM | POA: Diagnosis not present

## 2017-05-20 DIAGNOSIS — R1013 Epigastric pain: Secondary | ICD-10-CM | POA: Diagnosis not present

## 2017-05-20 DIAGNOSIS — I10 Essential (primary) hypertension: Secondary | ICD-10-CM

## 2017-05-20 MED ORDER — TRIAMTERENE-HCTZ 37.5-25 MG PO TABS: 1.0000 | ORAL_TABLET | Freq: Every day | ORAL | 1 refills | Status: DC

## 2017-05-20 MED ORDER — PROMETHAZINE HCL 25 MG PO TABS: 25.0000 mg | ORAL_TABLET | Freq: Three times a day (TID) | ORAL | 1 refills | Status: DC | PRN

## 2017-05-20 NOTE — Patient Instructions (Addendum)
   IF you received an x-ray today, you will receive an invoice from [facility]. Please contact [facility] at [phone] with questions or concerns regarding your invoice.   IF you received labwork today, you will receive an invoice from [facility]. Please contact [facility] at [phone] with questions or concerns regarding your invoice.   Our billing staff will not be able to assist you with questions regarding bills from these companies.  You will be contacted with the lab results as soon as they are available. The fastest way to get your results is to activate your My Chart account. Instructions are located on the last page of this paperwork. If you have not heard from us regarding the results in 2 weeks, please contact this office.      Managing Your Hypertension Hypertension is commonly called high blood pressure. This is when the force of your blood pressing against the walls of your arteries is too strong. Arteries are blood vessels that carry blood from your heart throughout your body. Hypertension forces the heart to work harder to pump blood, and may cause the arteries to become narrow or stiff. Having untreated or uncontrolled hypertension can cause heart attack, stroke, kidney disease, and other problems. What are blood pressure readings? A blood pressure reading consists of a higher number over a lower number. Ideally, your blood pressure should be below 120/80. The first ("top") number is called the systolic pressure. It is a measure of the pressure in your arteries as your heart beats. The second ("bottom") number is called the diastolic pressure. It is a measure of the pressure in your arteries as the heart relaxes. What does my blood pressure reading mean? Blood pressure is classified into four stages. Based on your blood pressure reading, your health care provider may use the following stages to determine what type of treatment you need, if any. Systolic  pressure and diastolic pressure are measured in a unit called mm Hg. Normal  Systolic pressure: below 120.  Diastolic pressure: below 80. Elevated  Systolic pressure: 120-129.  Diastolic pressure: below 80. Hypertension stage 1  Systolic pressure: 130-139.  Diastolic pressure: 80-89. Hypertension stage 2  Systolic pressure: 140 or above.  Diastolic pressure: 90 or above. What health risks are associated with hypertension? Managing your hypertension is an important responsibility. Uncontrolled hypertension can lead to:  A heart attack.  A stroke.  A weakened blood vessel (aneurysm).  Heart failure.  Kidney damage.  Eye damage.  Metabolic syndrome.  Memory and concentration problems.  What changes can I make to manage my hypertension? Hypertension can be managed by making lifestyle changes and possibly by taking medicines. Your health care provider will help you make a plan to bring your blood pressure within a normal range. Eating and drinking  Eat a diet that is high in fiber and potassium, and low in salt (sodium), added sugar, and fat. An example eating plan is called the DASH (Dietary Approaches to Stop Hypertension) diet. To eat this way: ? Eat plenty of fresh fruits and vegetables. Try to fill half of your plate at each meal with fruits and vegetables. ? Eat whole grains, such as whole wheat pasta, brown rice, or whole grain bread. Fill about one quarter of your plate with whole grains. ? Eat low-fat diary products. ? Avoid fatty cuts of meat, processed or cured meats, and poultry with skin. Fill about one quarter of your plate with lean proteins such as fish, chicken without skin, beans, eggs,   and tofu. ? Avoid premade and processed foods. These tend to be higher in sodium, added sugar, and fat.  Reduce your daily sodium intake. Most people with hypertension should eat less than 1,500 mg of sodium a day.  Limit alcohol intake to no more than 1 drink a day  for nonpregnant women and 2 drinks a day for men. One drink equals 12 oz of beer, 5 oz of wine, or 1 oz of hard liquor. Lifestyle  Work with your health care provider to maintain a healthy body weight, or to lose weight. Ask what an ideal weight is for you.  Get at least 30 minutes of exercise that causes your heart to beat faster (aerobic exercise) most days of the week. Activities may include walking, swimming, or biking.  Include exercise to strengthen your muscles (resistance exercise), such as weight lifting, as part of your weekly exercise routine. Try to do these types of exercises for 30 minutes at least 3 days a week.  Do not use any products that contain nicotine or tobacco, such as cigarettes and e-cigarettes. If you need help quitting, ask your health care provider.  Control any long-term (chronic) conditions you have, such as high cholesterol or diabetes. Monitoring  Monitor your blood pressure at home as told by your health care provider. Your personal target blood pressure may vary depending on your medical conditions, your age, and other factors.  Have your blood pressure checked regularly, as often as told by your health care provider. Working with your health care provider  Review all the medicines you take with your health care provider because there may be side effects or interactions.  Talk with your health care provider about your diet, exercise habits, and other lifestyle factors that may be contributing to hypertension.  Visit your health care provider regularly. Your health care provider can help you create and adjust your plan for managing hypertension. Will I need medicine to control my blood pressure? Your health care provider may prescribe medicine if lifestyle changes are not enough to get your blood pressure under control, and if:  Your systolic blood pressure is 130 or higher.  Your diastolic blood pressure is 80 or higher.  Take medicines only as told  by your health care provider. Follow the directions carefully. Blood pressure medicines must be taken as prescribed. The medicine does not work as well when you skip doses. Skipping doses also puts you at risk for problems. Contact a health care provider if:  You think you are having a reaction to medicines you have taken.  You have repeated (recurrent) headaches.  You feel dizzy.  You have swelling in your ankles.  You have trouble with your vision. Get help right away if:  You develop a severe headache or confusion.  You have unusual weakness or numbness, or you feel faint.  You have severe pain in your chest or abdomen.  You vomit repeatedly.  You have trouble breathing. Summary  Hypertension is when the force of blood pumping through your arteries is too strong. If this condition is not controlled, it may put you at risk for serious complications.  Your personal target blood pressure may vary depending on your medical conditions, your age, and other factors. For most people, a normal blood pressure is less than 120/80.  Hypertension is managed by lifestyle changes, medicines, or both. Lifestyle changes include weight loss, eating a healthy, low-sodium diet, exercising more, and limiting alcohol. This information is not intended to replace advice   given to you by your health care provider. Make sure you discuss any questions you have with your health care provider. Document Released: 05/26/2012 Document Revised: 07/30/2016 Document Reviewed: 07/30/2016 Elsevier Interactive Patient Education  2018 [employer]vier Inc.  

## 2017-05-20 NOTE — Progress Notes (Addendum)
Subjective:  By signing my name below, I, Moises Blood, attest that this documentation has been prepared under the direction and in the presence of [name], MD. Electronically Signed: Moises Blood, [place]. 05/20/2017 , 2:11 PM .  Patient was seen in Room 3 .   Patient ID: Angela Graves, female    DOB: [birth year], 61 y.o.   MRN: [id number] Chief Complaint  Patient presents with  . Hypertension   HPI Angela Graves is a 61 y.o. female who presents to Primary Care at [facility] for follow up on HTN. Started patient on HCTZ 25mg  QD at last visit for persistently elevated BP in 140-150s. She does have urinary frequency at baseline as she tries to drink a lot of water for her health.   Patient states she had really severe muscle cramps from HCTZ in the first 3-4 days. Her friend, who used to work as a Marine scientist, informed the patient to not panic and suggested to take her potassium supplements. She notes her cramps have improved. She denies any increased urinary frequency. Her home BP readings were high 120s/high 70s initially, but gradually crept up. She enjoys occasional Martini.   She also requests medication refill of phenergan, as she's planning to travel soon.   Past Medical History:  Diagnosis Date  . Migraine    Prior to Admission medications   Medication Sig Start Date End Date Taking? Authorizing Provider  clotrimazole-betamethasone (LOTRISONE) cream Apply 1 application topically 2 (two) times daily. 01/12/17  Yes Shawnee Knapp, MD  diazepam (VALIUM) 5 MG tablet Take 1 tablet (5 mg total) by mouth every 12 (twelve) hours as needed for anxiety. 01/12/17  Yes Shawnee Knapp, MD  fluticasone (FLONASE) 50 MCG/ACT nasal spray Place 2 sprays into both nostrils at bedtime. 04/30/17  Yes Shawnee Knapp, MD  hydrochlorothiazide (HYDRODIURIL) 25 MG tablet Take 1 tablet (25 mg total) by mouth daily. 04/30/17  Yes Shawnee Knapp, MD  promethazine (PHENERGAN) 25 MG tablet Take 1 tablet (25 mg total) by mouth  every 8 (eight) hours as needed for nausea. 07/11/15 05/12/16  Shawnee Knapp, MD   Allergies  Allergen Reactions  . Codeine Itching  . Morphine And Related Other (See Comments)    Hyperactive   Past Surgical History:  Procedure Laterality Date  . ABDOMINAL HYSTERECTOMY    . CHOLECYSTECTOMY    . JOINT REPLACEMENT     Family History  Problem Relation Age of Onset  . Adopted: Yes  . Family history unknown: Yes   Social History   Social History  . Marital status: Married    Spouse name: N/A  . Number of children: N/A  . Years of education: N/A   Social History Main Topics  . Smoking status: Former Research scientist (life sciences)  . Smokeless tobacco: Former Systems developer  . Alcohol use 3.0 oz/week    5 Glasses of wine per week     Comment: socially  . Drug use: No  . Sexual activity: No   Other Topics Concern  . None   Social History Narrative  . None   Depression screen [facility] 2/9 05/20/2017 04/30/2017 01/12/2017 04/24/2016 07/11/2015  Decreased Interest 0 0 0 0 0  Down, Depressed, Hopeless 0 0 0 0 0  PHQ - 2 Score 0 0 0 0 0    Review of Systems  Constitutional: Negative for fatigue and unexpected weight change.  Respiratory: Negative for chest tightness and shortness of breath.   Cardiovascular: Negative for chest pain,  palpitations and leg swelling.  Gastrointestinal: Negative for abdominal pain and blood in stool.  Genitourinary: Negative for frequency.  Musculoskeletal: Positive for myalgias.  Neurological: Negative for dizziness, syncope, light-headedness and headaches.       Objective:   Physical Exam  Constitutional: She is oriented to person, place, and time. She appears well-developed and well-nourished. No distress.  HENT:  Head: Normocephalic and atraumatic.  Eyes: Pupils are equal, round, and reactive to light. EOM are normal.  Neck: Neck supple.  Cardiovascular: Regular rhythm, S1 normal, S2 normal and normal heart sounds.  Tachycardia present.  Exam reveals no gallop and no friction  rub.   No murmur heard. Pulmonary/Chest: Effort normal and breath sounds normal. No respiratory distress.  Musculoskeletal: Normal range of motion.  Neurological: She is alert and oriented to person, place, and time.  Skin: Skin is warm and dry.  Psychiatric: She has a normal mood and affect. Her behavior is normal.  Nursing note and vitals reviewed.   BP (!) 168/93   Pulse 88   Temp 97.6 F (36.4 C) (Oral)   Resp 17   Ht 5' 2.5" (1.588 m)   Wt 154 lb (69.9 kg)   SpO2 98%   BMI 27.72 kg/m   [2:09PM] BP rechecked in room, (left arm, manual): 150/85    Assessment & Plan:  Sent dentist Augustina Mood permission to do home sleep study.  1. Essential hypertension - not improved at all since starting hctz 25 and having muscle cramps - presumably from low K - fortunately have now improved but as need to add in an additional med, will use triamterene in hopes of balancing out K - if no improvement in cramping, maybe due to dehydration so consider change to lisinopril-hctz. Will defer labs to f/u since pt w/ some needle phobia and will want to check bmp sev wks after she is on the bp med that we are planning to keep her on for the forseeable future.   2. Epigastric pain   3. Nausea - not currently symptomatic, has sxs when flying and preparing for trip so refilled.   Due for CPE. - due for mammogram Cbc, cmp, peripheral smear, hiv due to leukopenia - pt refuses latter due to lack of risk- will do labs at next visit since not drawing blood today   Meds ordered this encounter  Medications  . triamterene-hydrochlorothiazide (MAXZIDE-25) 37.5-25 MG tablet    Sig: Take 1 tablet by mouth daily.    Dispense:  30 tablet    Refill:  1  . promethazine (PHENERGAN) 25 MG tablet    Sig: Take 1 tablet (25 mg total) by mouth every 8 (eight) hours as needed for nausea.    Dispense:  30 tablet    Refill:  1    I personally performed the services described in this documentation, which was scribed  in my presence. The recorded information has been reviewed and considered, and addended by me as needed.   [name], M.D.  Primary Care at [facility] [address] [place], [place] [address] [phone] phone [phone] fax  05/23/17 12:42 AM

## 2017-05-27 ENCOUNTER — Telehealth: Payer: Self-pay

## 2017-05-27 NOTE — Progress Notes (Signed)
Faxed

## 2017-06-11 ENCOUNTER — Encounter: Payer: BLUE CROSS/BLUE SHIELD | Admitting: Family Medicine

## 2017-06-16 ENCOUNTER — Ambulatory Visit
Admission: RE | Admit: 2017-06-16 | Discharge: 2017-06-16 | Disposition: A | Discharge status: Home / Self Care | Payer: BLUE CROSS/BLUE SHIELD | Source: Ambulatory Visit | Attending: Family Medicine | Admitting: Family Medicine

## 2017-06-16 DIAGNOSIS — Z1239 Encounter for other screening for malignant neoplasm of breast: Secondary | ICD-10-CM

## 2017-06-18 ENCOUNTER — Ambulatory Visit (INDEPENDENT_AMBULATORY_CARE_PROVIDER_SITE_OTHER): Payer: BLUE CROSS/BLUE SHIELD | Admitting: Family Medicine

## 2017-06-18 ENCOUNTER — Encounter: Payer: Self-pay | Admitting: Family Medicine

## 2017-06-18 VITALS — BP 140/80 | HR 86 | Temp 98.0°F | Resp 18 | Ht 62.5 in | Wt 153.0 lb

## 2017-06-18 DIAGNOSIS — Z79899 Other long term (current) drug therapy: Secondary | ICD-10-CM | POA: Diagnosis not present

## 2017-06-18 DIAGNOSIS — I10 Essential (primary) hypertension: Secondary | ICD-10-CM | POA: Diagnosis not present

## 2017-06-18 MED ORDER — AMLODIPINE BESYLATE 5 MG PO TABS: 5.0000 mg | ORAL_TABLET | Freq: Every day | ORAL | 1 refills | Status: DC

## 2017-06-18 NOTE — Progress Notes (Signed)
Subjective:    Patient ID: Angela Graves, female    DOB: 1955/12/07, 61 y.o.   MRN: [id number] Chief Complaint  Patient presents with  . Hypertension    Pt brought BP recordings from home.  . Follow-up   HPI  Dry paroxysmal coughing. +GERD. Helped by mint. Chronic sinus cong and pnd. Muscle cramping improved but still there.   Copugh keeping her awake at night.    Angela Graves is a 61 y.o. female who presents to Primary Care at [facility] for follow up on HTN. Started patient on HCTZ 25mg  QD at last visit for persistently elevated BP in 140-150s. She does have urinary frequency at baseline as she tries to drink a lot of water for her health.   Patient states she had really severe muscle cramps from HCTZ in the first 3-4 days. Her friend, who used to work as a Marine scientist, informed the patient to not panic and suggested to take her potassium supplements. She notes her cramps have improved. She denies any increased urinary frequency. Her home BP readings were high 120s/high 70s initially, but gradually crept up. She enjoys occasional Martini.   not improved at all since starting hctz 25 and having muscle cramps - presumably from low K - fortunately have now improved but as need to add in an additional med, will use triamterene in hopes of balancing out K - if no improvement in cramping, maybe due to dehydration so consider change to lisinopril-hctz.   Past Medical History:  Diagnosis Date  . Migraine    Prior to Admission medications   Medication Sig Start Date End Date Taking? Authorizing Provider  clotrimazole-betamethasone (LOTRISONE) cream Apply 1 application topically 2 (two) times daily. 01/12/17  Yes Shawnee Knapp, MD  diazepam (VALIUM) 5 MG tablet Take 1 tablet (5 mg total) by mouth every 12 (twelve) hours as needed for anxiety. 01/12/17  Yes Shawnee Knapp, MD  fluticasone (FLONASE) 50 MCG/ACT nasal spray Place 2 sprays into both nostrils at bedtime. 04/30/17  Yes Shawnee Knapp, MD    hydrochlorothiazide (HYDRODIURIL) 25 MG tablet Take 1 tablet (25 mg total) by mouth daily. 04/30/17  Yes Shawnee Knapp, MD  promethazine (PHENERGAN) 25 MG tablet Take 1 tablet (25 mg total) by mouth every 8 (eight) hours as needed for nausea. 07/11/15 05/12/16  Shawnee Knapp, MD   Allergies  Allergen Reactions  . Codeine Itching  . Morphine And Related Other (See Comments)    Hyperactive   Past Surgical History:  Procedure Laterality Date  . ABDOMINAL HYSTERECTOMY    . CHOLECYSTECTOMY    . JOINT REPLACEMENT     Family History  Problem Relation Age of Onset  . Adopted: Yes  . Family history unknown: Yes   Social History   Social History  . Marital status: Married    Spouse name: N/A  . Number of children: N/A  . Years of education: N/A   Social History Main Topics  . Smoking status: Former Research scientist (life sciences)  . Smokeless tobacco: Former Systems developer  . Alcohol use 3.0 oz/week    5 Glasses of wine per week     Comment: socially  . Drug use: No  . Sexual activity: No   Other Topics Concern  . Not on file   Social History Narrative  . No narrative on file   Depression screen [facility] 2/9 05/20/2017 04/30/2017 01/12/2017 04/24/2016 07/11/2015  Decreased Interest 0 0 0 0 0  Down, Depressed, Hopeless 0  0 0 0 0  PHQ - 2 Score 0 0 0 0 0    Review of Systems  Constitutional: Negative for fatigue and unexpected weight change.  Respiratory: Negative for chest tightness and shortness of breath.   Cardiovascular: Negative for chest pain, palpitations and leg swelling.  Gastrointestinal: Negative for abdominal pain and blood in stool.  Genitourinary: Negative for frequency.  Musculoskeletal: Positive for myalgias.  Neurological: Negative for dizziness, syncope, light-headedness and headaches.       Objective:   Physical Exam  Constitutional: She is oriented to person, place, and time. She appears well-developed and well-nourished. No distress.  HENT:  Head: Normocephalic and atraumatic.  Eyes: Pupils  are equal, round, and reactive to light. EOM are normal.  Neck: Neck supple.  Cardiovascular: Regular rhythm, S1 normal, S2 normal and normal heart sounds.  Tachycardia present.  Exam reveals no gallop and no friction rub.   No murmur heard. Pulmonary/Chest: Effort normal and breath sounds normal. No respiratory distress.  Musculoskeletal: Normal range of motion.  Neurological: She is alert and oriented to person, place, and time.  Skin: Skin is warm and dry.  Psychiatric: She has a normal mood and affect. Her behavior is normal.  Nursing note and vitals reviewed.   BP 140/80 (BP Location: Left Arm, Patient Position: Sitting, Cuff Size: Normal)   Pulse 86   Temp 98 F (36.7 C) (Oral)   Resp 18   Ht 5' 2.5" (1.588 m)   Wt 153 lb (69.4 kg)   SpO2 97%   BMI 27.54 kg/m      Assessment & Plan:  Due for CPE. - due for mammogram Cbc, cmp, peripheral smear, hiv due to leukopenia - pt refuses latter due to lack of risk- will do labs at next visit since not drawing blood today  1. Essential hypertension   2. Medication management     Meds ordered this encounter  Medications  . amLODipine (NORVASC) 5 MG tablet    Sig: Take 1 tablet (5 mg total) by mouth daily.    Dispense:  30 tablet    Refill:  1    [name], M.D.  Primary Care at [facility] [address] [place],  [address] [phone] phone [phone] fax  06/18/17 1:34 PM

## 2017-06-18 NOTE — Patient Instructions (Addendum)
   IF you received an x-ray today, you will receive an invoice from [facility]. Please contact [facility] at [phone] with questions or concerns regarding your invoice.   IF you received labwork today, you will receive an invoice from [facility]. Please contact [facility] at [phone] with questions or concerns regarding your invoice.   Our billing staff will not be able to assist you with questions regarding bills from these companies.  You will be contacted with the lab results as soon as they are available. The fastest way to get your results is to activate your My Chart account. Instructions are located on the last page of this paperwork. If you have not heard from us regarding the results in 2 weeks, please contact this office.      Managing Your Hypertension Hypertension is commonly called high blood pressure. This is when the force of your blood pressing against the walls of your arteries is too strong. Arteries are blood vessels that carry blood from your heart throughout your body. Hypertension forces the heart to work harder to pump blood, and may cause the arteries to become narrow or stiff. Having untreated or uncontrolled hypertension can cause heart attack, stroke, kidney disease, and other problems. What are blood pressure readings? A blood pressure reading consists of a higher number over a lower number. Ideally, your blood pressure should be below 120/80. The first ("top") number is called the systolic pressure. It is a measure of the pressure in your arteries as your heart beats. The second ("bottom") number is called the diastolic pressure. It is a measure of the pressure in your arteries as the heart relaxes. What does my blood pressure reading mean? Blood pressure is classified into four stages. Based on your blood pressure reading, your health care provider may use the following stages to determine what type of treatment you need, if any. Systolic  pressure and diastolic pressure are measured in a unit called mm Hg. Normal  Systolic pressure: below 120.  Diastolic pressure: below 80. Elevated  Systolic pressure: 120-129.  Diastolic pressure: below 80. Hypertension stage 1  Systolic pressure: 130-139.  Diastolic pressure: 80-89. Hypertension stage 2  Systolic pressure: 140 or above.  Diastolic pressure: 90 or above. What health risks are associated with hypertension? Managing your hypertension is an important responsibility. Uncontrolled hypertension can lead to:  A heart attack.  A stroke.  A weakened blood vessel (aneurysm).  Heart failure.  Kidney damage.  Eye damage.  Metabolic syndrome.  Memory and concentration problems.  What changes can I make to manage my hypertension? Hypertension can be managed by making lifestyle changes and possibly by taking medicines. Your health care provider will help you make a plan to bring your blood pressure within a normal range. Eating and drinking  Eat a diet that is high in fiber and potassium, and low in salt (sodium), added sugar, and fat. An example eating plan is called the DASH (Dietary Approaches to Stop Hypertension) diet. To eat this way: ? Eat plenty of fresh fruits and vegetables. Try to fill half of your plate at each meal with fruits and vegetables. ? Eat whole grains, such as whole wheat pasta, brown rice, or whole grain bread. Fill about one quarter of your plate with whole grains. ? Eat low-fat diary products. ? Avoid fatty cuts of meat, processed or cured meats, and poultry with skin. Fill about one quarter of your plate with lean proteins such as fish, chicken without skin, beans, eggs,   and tofu. ? Avoid premade and processed foods. These tend to be higher in sodium, added sugar, and fat.  Reduce your daily sodium intake. Most people with hypertension should eat less than 1,500 mg of sodium a day.  Limit alcohol intake to no more than 1 drink a day  for nonpregnant women and 2 drinks a day for men. One drink equals 12 oz of beer, 5 oz of wine, or 1 oz of hard liquor. Lifestyle  Work with your health care provider to maintain a healthy body weight, or to lose weight. Ask what an ideal weight is for you.  Get at least 30 minutes of exercise that causes your heart to beat faster (aerobic exercise) most days of the week. Activities may include walking, swimming, or biking.  Include exercise to strengthen your muscles (resistance exercise), such as weight lifting, as part of your weekly exercise routine. Try to do these types of exercises for 30 minutes at least 3 days a week.  Do not use any products that contain nicotine or tobacco, such as cigarettes and e-cigarettes. If you need help quitting, ask your health care provider.  Control any long-term (chronic) conditions you have, such as high cholesterol or diabetes. Monitoring  Monitor your blood pressure at home as told by your health care provider. Your personal target blood pressure may vary depending on your medical conditions, your age, and other factors.  Have your blood pressure checked regularly, as often as told by your health care provider. Working with your health care provider  Review all the medicines you take with your health care provider because there may be side effects or interactions.  Talk with your health care provider about your diet, exercise habits, and other lifestyle factors that may be contributing to hypertension.  Visit your health care provider regularly. Your health care provider can help you create and adjust your plan for managing hypertension. Will I need medicine to control my blood pressure? Your health care provider may prescribe medicine if lifestyle changes are not enough to get your blood pressure under control, and if:  Your systolic blood pressure is 130 or higher.  Your diastolic blood pressure is 80 or higher.  Take medicines only as told  by your health care provider. Follow the directions carefully. Blood pressure medicines must be taken as prescribed. The medicine does not work as well when you skip doses. Skipping doses also puts you at risk for problems. Contact a health care provider if:  You think you are having a reaction to medicines you have taken.  You have repeated (recurrent) headaches.  You feel dizzy.  You have swelling in your ankles.  You have trouble with your vision. Get help right away if:  You develop a severe headache or confusion.  You have unusual weakness or numbness, or you feel faint.  You have severe pain in your chest or abdomen.  You vomit repeatedly.  You have trouble breathing. Summary  Hypertension is when the force of blood pumping through your arteries is too strong. If this condition is not controlled, it may put you at risk for serious complications.  Your personal target blood pressure may vary depending on your medical conditions, your age, and other factors. For most people, a normal blood pressure is less than 120/80.  Hypertension is managed by lifestyle changes, medicines, or both. Lifestyle changes include weight loss, eating a healthy, low-sodium diet, exercising more, and limiting alcohol. This information is not intended to replace advice   given to you by your health care provider. Make sure you discuss any questions you have with your health care provider. Document Released: 05/26/2012 Document Revised: 07/30/2016 Document Reviewed: 07/30/2016 Elsevier Interactive Patient Education  2018 Elsevier Inc.  

## 2017-07-25 ENCOUNTER — Other Ambulatory Visit: Payer: Self-pay | Admitting: Family Medicine

## 2017-07-27 ENCOUNTER — Other Ambulatory Visit: Payer: Self-pay | Admitting: Family Medicine

## 2017-07-27 NOTE — Telephone Encounter (Signed)
Controlled medication. Thanks. 

## 2017-07-28 NOTE — Telephone Encounter (Signed)
Only used 5 of the 6 refills on her last rx (6th expired) but does take bid most of the time.  Today I have utilized the [place] Controlled Substance Registry's online query to confirm compliance regarding the patient's controlled medications. My review reveals appropriate prescription fills and that I am the sole provider of these medications. Rechecks will occur regularly and the patient is aware of our use of the system.

## 2017-07-30 ENCOUNTER — Other Ambulatory Visit: Payer: Self-pay | Admitting: Family Medicine

## 2017-08-31 ENCOUNTER — Telehealth: Payer: Self-pay

## 2017-08-31 MED ORDER — AMLODIPINE BESYLATE 10 MG PO TABS: 10.0000 mg | ORAL_TABLET | Freq: Every day | ORAL | 1 refills | Status: DC

## 2017-08-31 NOTE — Telephone Encounter (Signed)
Increased amlodipine from 5 to 10mg  - sent new rx to pharm. Let me know how her BPs are in ~ 110mo.

## 2017-08-31 NOTE — Addendum Note (Signed)
Addended by: Shawnee Knapp on: 08/31/2017 01:06 PM   Modules accepted: Orders

## 2017-08-31 NOTE — Telephone Encounter (Signed)
Copied from [place] [id number]. Topic: General - Other >> Aug 31, 2017 10:41 AM Marin Olp L wrote: Reason for CRM: Patient is on Amlodipine 5mg  she states that some of her blood pressure reading has been high in the systolic reading any where from 158-682 diastolic readings range from anywhere to 70-90.  She called on 11/29 wanting to know if Dr.Shaw would want to up dosage of medicine before she refilled it, but she still hasn't heard back and has refilled on 11/30. I see the original CRM is resolved, however patient does not believe she was contacted. Please call back to advise.

## 2017-09-01 NOTE — Telephone Encounter (Signed)
Patient was informed.

## 2017-09-11 ENCOUNTER — Other Ambulatory Visit: Payer: Self-pay | Admitting: Family Medicine

## 2017-10-28 ENCOUNTER — Other Ambulatory Visit: Payer: Self-pay | Admitting: Family Medicine

## 2017-11-04 ENCOUNTER — Other Ambulatory Visit: Payer: Self-pay

## 2017-11-04 ENCOUNTER — Ambulatory Visit (INDEPENDENT_AMBULATORY_CARE_PROVIDER_SITE_OTHER): Payer: BLUE CROSS/BLUE SHIELD | Admitting: Family Medicine

## 2017-11-04 ENCOUNTER — Encounter: Payer: Self-pay | Admitting: Family Medicine

## 2017-11-04 VITALS — BP 120/64 | HR 88 | Temp 98.1°F | Resp 16 | Ht 62.5 in | Wt 156.4 lb

## 2017-11-04 DIAGNOSIS — D72819 Decreased white blood cell count, unspecified: Secondary | ICD-10-CM | POA: Diagnosis not present

## 2017-11-04 DIAGNOSIS — K760 Fatty (change of) liver, not elsewhere classified: Secondary | ICD-10-CM | POA: Diagnosis not present

## 2017-11-04 DIAGNOSIS — I10 Essential (primary) hypertension: Secondary | ICD-10-CM

## 2017-11-04 DIAGNOSIS — E7800 Pure hypercholesterolemia, unspecified: Secondary | ICD-10-CM | POA: Diagnosis not present

## 2017-11-04 MED ORDER — AMLODIPINE BESYLATE 10 MG PO TABS: ORAL_TABLET | ORAL | 3 refills | Status: DC

## 2017-11-04 NOTE — Patient Instructions (Addendum)
   IF you received an x-ray today, you will receive an invoice from [facility]. Please contact [facility] at [phone] with questions or concerns regarding your invoice.   IF you received labwork today, you will receive an invoice from [facility]. Please contact [facility] at [phone] with questions or concerns regarding your invoice.   Our billing staff will not be able to assist you with questions regarding bills from these companies.  You will be contacted with the lab results as soon as they are available. The fastest way to get your results is to activate your My Chart account. Instructions are located on the last page of this paperwork. If you have not heard from us regarding the results in 2 weeks, please contact this office.      DASH Eating Plan DASH stands for "Dietary Approaches to Stop Hypertension." The DASH eating plan is a healthy eating plan that has been shown to reduce high blood pressure (hypertension). It may also reduce your risk for type 2 diabetes, heart disease, and stroke. The DASH eating plan may also help with weight loss. What are tips for following this plan? General guidelines  Avoid eating more than 2,300 mg (milligrams) of salt (sodium) a day. If you have hypertension, you may need to reduce your sodium intake to 1,500 mg a day.  Limit alcohol intake to no more than 1 drink a day for nonpregnant women and 2 drinks a day for men. One drink equals 12 oz of beer, 5 oz of wine, or 1 oz of hard liquor.  Work with your health care provider to maintain a healthy body weight or to lose weight. Ask what an ideal weight is for you.  Get at least 30 minutes of exercise that causes your heart to beat faster (aerobic exercise) most days of the week. Activities may include walking, swimming, or biking.  Work with your health care provider or diet and nutrition specialist (dietitian) to adjust your eating plan to your individual calorie  needs. Reading food labels  Check food labels for the amount of sodium per serving. Choose foods with less than 5 percent of the Daily Value of sodium. Generally, foods with less than 300 mg of sodium per serving fit into this eating plan.  To find whole grains, look for the word "whole" as the first word in the ingredient list. Shopping  Buy products labeled as "low-sodium" or "no salt added."  Buy fresh foods. Avoid canned foods and premade or frozen meals. Cooking  Avoid adding salt when cooking. Use salt-free seasonings or herbs instead of table salt or sea salt. Check with your health care provider or pharmacist before using salt substitutes.  Do not fry foods. Cook foods using healthy methods such as baking, boiling, grilling, and broiling instead.  Cook with heart-healthy oils, such as olive, canola, soybean, or sunflower oil. Meal planning   Eat a balanced diet that includes: ? 5 or more servings of fruits and vegetables each day. At each meal, try to fill half of your plate with fruits and vegetables. ? Up to 6-8 servings of whole grains each day. ? Less than 6 oz of lean meat, poultry, or fish each day. A 3-oz serving of meat is about the same size as a deck of cards. One egg equals 1 oz. ? 2 servings of low-fat dairy each day. ? A serving of nuts, seeds, or beans 5 times each week. ? Heart-healthy fats. Healthy fats called Omega-3 fatty acids are   found in foods such as flaxseeds and coldwater fish, like sardines, salmon, and mackerel.  Limit how much you eat of the following: ? Canned or prepackaged foods. ? Food that is high in trans fat, such as fried foods. ? Food that is high in saturated fat, such as fatty meat. ? Sweets, desserts, sugary drinks, and other foods with added sugar. ? Full-fat dairy products.  Do not salt foods before eating.  Try to eat at least 2 vegetarian meals each week.  Eat more home-cooked food and less restaurant, buffet, and fast  food.  When eating at a restaurant, ask that your food be prepared with less salt or no salt, if possible. What foods are recommended? The items listed may not be a complete list. Talk with your dietitian about what dietary choices are best for you. Grains Whole-grain or whole-wheat bread. Whole-grain or whole-wheat pasta. Brown rice. Oatmeal. Quinoa. Bulgur. Whole-grain and low-sodium cereals. Pita bread. Low-fat, low-sodium crackers. Whole-wheat flour tortillas. Vegetables Fresh or frozen vegetables (raw, steamed, roasted, or grilled). Low-sodium or reduced-sodium tomato and vegetable juice. Low-sodium or reduced-sodium tomato sauce and tomato paste. Low-sodium or reduced-sodium canned vegetables. Fruits All fresh, dried, or frozen fruit. Canned fruit in natural juice (without added sugar). Meat and other protein foods Skinless chicken or turkey. Ground chicken or turkey. Pork with fat trimmed off. Fish and seafood. Egg whites. Dried beans, peas, or lentils. Unsalted nuts, nut butters, and seeds. Unsalted canned beans. Lean cuts of beef with fat trimmed off. Low-sodium, lean deli meat. Dairy Low-fat (1%) or fat-free (skim) milk. Fat-free, low-fat, or reduced-fat cheeses. Nonfat, low-sodium ricotta or cottage cheese. Low-fat or nonfat yogurt. Low-fat, low-sodium cheese. Fats and oils Soft margarine without trans fats. Vegetable oil. Low-fat, reduced-fat, or light mayonnaise and salad dressings (reduced-sodium). Canola, safflower, olive, soybean, and sunflower oils. Avocado. Seasoning and other foods Herbs. Spices. Seasoning mixes without salt. Unsalted popcorn and pretzels. Fat-free sweets. What foods are not recommended? The items listed may not be a complete list. Talk with your dietitian about what dietary choices are best for you. Grains Baked goods made with fat, such as croissants, muffins, or some breads. Dry pasta or rice meal packs. Vegetables Creamed or fried vegetables. Vegetables  in a cheese sauce. Regular canned vegetables (not low-sodium or reduced-sodium). Regular canned tomato sauce and paste (not low-sodium or reduced-sodium). Regular tomato and vegetable juice (not low-sodium or reduced-sodium). Pickles. Olives. Fruits Canned fruit in a light or heavy syrup. Fried fruit. Fruit in cream or butter sauce. Meat and other protein foods Fatty cuts of meat. Ribs. Fried meat. Bacon. Sausage. Bologna and other processed lunch meats. Salami. Fatback. Hotdogs. Bratwurst. Salted nuts and seeds. Canned beans with added salt. Canned or smoked fish. Whole eggs or egg yolks. Chicken or turkey with skin. Dairy Whole or 2% milk, cream, and half-and-half. Whole or full-fat cream cheese. Whole-fat or sweetened yogurt. Full-fat cheese. Nondairy creamers. Whipped toppings. Processed cheese and cheese spreads. Fats and oils Butter. Stick margarine. Lard. Shortening. Ghee. Bacon fat. Tropical oils, such as coconut, palm kernel, or palm oil. Seasoning and other foods Salted popcorn and pretzels. Onion salt, garlic salt, seasoned salt, table salt, and sea salt. Worcestershire sauce. Tartar sauce. Barbecue sauce. Teriyaki sauce. Soy sauce, including reduced-sodium. Steak sauce. Canned and packaged gravies. Fish sauce. Oyster sauce. Cocktail sauce. Horseradish that you find on the shelf. Ketchup. Mustard. Meat flavorings and tenderizers. Bouillon cubes. Hot sauce and Tabasco sauce. Premade or packaged marinades. Premade or packaged taco seasonings.   Relishes. Regular salad dressings. Where to find more information:  National Heart, Lung, and Blood Institute: www.nhlbi.nih.gov  American Heart Association: www.heart.org Summary  The DASH eating plan is a healthy eating plan that has been shown to reduce high blood pressure (hypertension). It may also reduce your risk for type 2 diabetes, heart disease, and stroke.  With the DASH eating plan, you should limit salt (sodium) intake to 2,300 mg a  day. If you have hypertension, you may need to reduce your sodium intake to 1,500 mg a day.  When on the DASH eating plan, aim to eat more fresh fruits and vegetables, whole grains, lean proteins, low-fat dairy, and heart-healthy fats.  Work with your health care provider or diet and nutrition specialist (dietitian) to adjust your eating plan to your individual calorie needs. This information is not intended to replace advice given to you by your health care provider. Make sure you discuss any questions you have with your health care provider. Document Released: 08/21/2011 Document Revised: 08/25/2016 Document Reviewed: 08/25/2016 Elsevier Interactive Patient Education  2018 Elsevier Inc.  

## 2017-11-04 NOTE — Progress Notes (Addendum)
Subjective:  This chart was scribed for Shawnee Knapp, MD by [name], at [facility].  This patient was seen in room 1 and the patient's care was started at 8:58 AM.   Chief Complaint  Patient presents with  . Medication Refill     Patient ID: Angela Graves, female    DOB: [birth year], 62 y.o.   MRN: [id number]  HPI HPI Comments: Angela Graves is a 62 y.o. female who presents to Primary Care at [facility] for a med refill.  Patient states that she doesn't check her blood pressure much as she has been having a "very chaotic life".  When she does, it ranges around 130's/80's.  She has been compliant with her Amlodipine.  She does have muscle cramps in her back intermittently but this does not bother her to the point where she has to take anything for relief.  Patient denies any swelling in her extremities. She is currently taking 1000 units of Vitamin D.   Not fasting today - ate egg biscuit w/ vegan cheese.   Patient Active Problem List   Diagnosis Date Noted  . Hypertension 04/29/2017  . Pure hypercholesterolemia 04/29/2017  . Vitamin D deficiency 04/29/2017  . Anxiety state 04/29/2017  . Chronic constipation 04/29/2017  . Fatty liver 04/29/2017   Past Medical History:  Diagnosis Date  . Migraine    Past Surgical History:  Procedure Laterality Date  . ABDOMINAL HYSTERECTOMY    . CHOLECYSTECTOMY    . JOINT REPLACEMENT     Allergies  Allergen Reactions  . Codeine Itching  . Morphine And Related Other (See Comments)    Hyperactive   Prior to Admission medications   Medication Sig Start Date End Date Taking? Authorizing Provider  amLODipine (NORVASC) 10 MG tablet TAKE 1 TABLET(10 MG) BY MOUTH DAILY 10/28/17   Shawnee Knapp, MD  amLODipine (NORVASC) 5 MG tablet TAKE 1 TABLET(5 MG) BY MOUTH DAILY 09/11/17   Shawnee Knapp, MD  diazepam (VALIUM) 5 MG tablet TAKE 1 TABLET BY MOUTH EVERY 12 HOURS AS NEEDED FOR ANXIETY 07/28/17   Shawnee Knapp, MD  promethazine  (PHENERGAN) 25 MG tablet Take 1 tablet (25 mg total) by mouth every 8 (eight) hours as needed for nausea. 05/20/17 03/22/18  Shawnee Knapp, MD   Family History  Adopted: Yes  Family history unknown: Yes    Social History   Socioeconomic History  . Marital status: Married    Spouse name: Not on file  . Number of children: Not on file  . Years of education: Not on file  . Highest education level: Not on file  Social Needs  . Financial resource strain: Not on file  . Food insecurity - worry: Not on file  . Food insecurity - inability: Not on file  . Transportation needs - medical: Not on file  . Transportation needs - non-medical: Not on file  Occupational History  . Not on file  Tobacco Use  . Smoking status: Former Research scientist (life sciences)  . Smokeless tobacco: Former Network engineer and Sexual Activity  . Alcohol use: Yes    Alcohol/week: 3.0 oz    Types: 5 Glasses of wine per week    Comment: socially  . Drug use: No  . Sexual activity: No    Birth control/protection: Abstinence  Other Topics Concern  . Not on file  Social History Narrative  . Not on file   Depression screen [facility] 2/9 11/04/2017 06/18/2017 05/20/2017  04/30/2017 01/12/2017  Decreased Interest 0 0 0 0 0  Down, Depressed, Hopeless 0 0 0 0 0  PHQ - 2 Score 0 0 0 0 0    Review of Systems  Constitutional: Negative for chills and fever.  Eyes: Negative for pain and redness.  Respiratory: Negative for cough, choking and shortness of breath.   Cardiovascular: Negative for leg swelling.  Gastrointestinal: Negative for nausea and vomiting.  Musculoskeletal: Negative for neck pain and neck stiffness.       Muscle cramps  Neurological: Negative for speech difficulty.       Objective:   Physical Exam  Constitutional: She appears well-developed and well-nourished. No distress.  HENT:  Head: Normocephalic and atraumatic.  Neck:  Thyroid is normal.   Cardiovascular: Normal rate, regular rhythm, S1 normal, S2 normal and normal heart  sounds.  No murmur heard. Pulmonary/Chest: Effort normal and breath sounds normal. No respiratory distress.  Neurological: She is alert.  Skin: Skin is warm and dry.  Psychiatric: She has a normal mood and affect.    Vitals:   11/04/17 0839  BP: 120/64  Pulse: 88  Resp: 16  Temp: 98.1 F (36.7 C)  SpO2: 97%  Weight: 156 lb 6.4 oz (70.9 kg)  Height: 5' 2.5" (1.588 m)   EKG: NSR, no acute ischemic changes noted. No significant change noted when compared to prior EKG done 03/14/2012.   I have personally reviewed the EKG tracing and agree with the computer interpretation: Sinus Rhythm WITHIN NORMAL LIMITS     Assessment & Plan:   Okay to refill promethazine X 2 months and valium x 6 months when requested . Will be due for valium refill early May.  1. Essential hypertension - much improved on amlodipine 10 - continue  2. Fatty liver - was improving w/ tlc - recheck.  3. Pure hypercholesterolemia - pt had improvement w/ tlc, not fasting today  4. Leukopenia, unspecified type - chronic and stable   Ok to revill valium x 6 mos when requested in early May  Orders Placed This Encounter  Procedures  . CBC with Differential/Platelet  . Comprehensive metabolic panel    Order Specific Question:   Has the patient fasted?    Answer:   Yes  . HIV antibody  . Pathologist smear review  . EKG 12-Lead    Meds ordered this encounter  Medications  . amLODipine (NORVASC) 10 MG tablet    Sig: TAKE 1 TABLET(10 MG) BY MOUTH DAILY    Dispense:  90 tablet    Refill:  3    I personally performed the services described in this documentation, which was scribed in my presence. The recorded information has been reviewed and considered, and addended by me as needed.   [name], M.D.  Primary Care at [facility] [address] [place],  [address] [phone] phone [phone] fax  11/06/17 9:43 PM

## 2017-11-05 LAB — CBC WITH DIFFERENTIAL/PLATELET
Basophils Absolute: 0.1 10*3/uL (ref 0.0–0.2)
Basos: 1 %
EOS (ABSOLUTE): 0.1 10*3/uL (ref 0.0–0.4)
Eos: 4 %
Hematocrit: 41.9 % (ref 34.0–46.6)
Hemoglobin: 14.6 g/dL (ref 11.1–15.9)
Immature Grans (Abs): 0 10*3/uL (ref 0.0–0.1)
Immature Granulocytes: 0 %
Lymphocytes Absolute: 1.8 10*3/uL (ref 0.7–3.1)
Lymphs: 47 %
MCH: 31.1 pg (ref 26.6–33.0)
MCHC: 34.8 g/dL (ref 31.5–35.7)
MCV: 89 fL (ref 79–97)
Monocytes Absolute: 0.3 10*3/uL (ref 0.1–0.9)
Monocytes: 7 %
Neutrophils Absolute: 1.6 10*3/uL (ref 1.4–7.0)
Neutrophils: 41 %
Platelets: 202 10*3/uL (ref 150–379)
RBC: 4.7 x10E6/uL (ref 3.77–5.28)
RDW: 12.5 % (ref 12.3–15.4)
WBC: 3.8 10*3/uL (ref 3.4–10.8)

## 2017-11-05 LAB — COMPREHENSIVE METABOLIC PANEL
ALT: 93 IU/L — ABNORMAL HIGH (ref 0–32)
AST: 56 IU/L — ABNORMAL HIGH (ref 0–40)
Albumin/Globulin Ratio: 1.9 (ref 1.2–2.2)
Albumin: 4.5 g/dL (ref 3.6–4.8)
Alkaline Phosphatase: 94 IU/L (ref 39–117)
BUN/Creatinine Ratio: 15 (ref 12–28)
BUN: 13 mg/dL (ref 8–27)
Bilirubin Total: 0.5 mg/dL (ref 0.0–1.2)
CO2: 23 mmol/L (ref 20–29)
Calcium: 9.3 mg/dL (ref 8.7–10.3)
Chloride: 102 mmol/L (ref 96–106)
Creatinine, Ser: 0.84 mg/dL (ref 0.57–1.00)
GFR calc Af Amer: 86 mL/min/{1.73_m2} (ref >59)
GFR calc non Af Amer: 74 mL/min/{1.73_m2} (ref >59)
Globulin, Total: 2.4 g/dL (ref 1.5–4.5)
Glucose: 98 mg/dL (ref 65–99)
Potassium: 4.4 mmol/L (ref 3.5–5.2)
Sodium: 140 mmol/L (ref 134–144)
Total Protein: 6.9 g/dL (ref 6.0–8.5)

## 2017-11-05 LAB — HIV ANTIBODY (ROUTINE TESTING W REFLEX): HIV Screen 4th Generation wRfx: NONREACTIVE

## 2017-11-06 LAB — PATHOLOGIST SMEAR REVIEW
Basophils Absolute: 0.1 10*3/uL (ref 0.0–0.2)
Basos: 2 %
EOS (ABSOLUTE): 0.1 10*3/uL (ref 0.0–0.4)
Eos: 4 %
Hematocrit: 42.6 % (ref 34.0–46.6)
Hemoglobin: 14.5 g/dL (ref 11.1–15.9)
Immature Grans (Abs): 0 10*3/uL (ref 0.0–0.1)
Immature Granulocytes: 0 %
Lymphocytes Absolute: 1.9 10*3/uL (ref 0.7–3.1)
Lymphs: 47 %
MCH: 31.1 pg (ref 26.6–33.0)
MCHC: 34 g/dL (ref 31.5–35.7)
MCV: 91 fL (ref 79–97)
Monocytes Absolute: 0.2 10*3/uL (ref 0.1–0.9)
Monocytes: 6 %
Neutrophils Absolute: 1.6 10*3/uL (ref 1.4–7.0)
Neutrophils: 41 %
Path Rev PLTs: NORMAL
Path Rev RBC: NORMAL
Path Rev WBC: NORMAL
Platelets: 192 10*3/uL (ref 150–379)
RBC: 4.66 x10E6/uL (ref 3.77–5.28)
RDW: 12.5 % (ref 12.3–15.4)
WBC: 3.9 10*3/uL (ref 3.4–10.8)

## 2018-01-20 ENCOUNTER — Other Ambulatory Visit: Payer: Self-pay | Admitting: Family Medicine

## 2018-01-20 NOTE — Telephone Encounter (Signed)
Refill  Request  Valium  LOV 11/04/2017  Pharmacy on File

## 2018-05-03 ENCOUNTER — Encounter: Payer: Self-pay | Admitting: Family Medicine

## 2018-05-03 ENCOUNTER — Other Ambulatory Visit: Payer: Self-pay

## 2018-05-03 ENCOUNTER — Ambulatory Visit (INDEPENDENT_AMBULATORY_CARE_PROVIDER_SITE_OTHER): Payer: BLUE CROSS/BLUE SHIELD | Admitting: Family Medicine

## 2018-05-03 VITALS — BP 116/70 | HR 80 | Temp 98.0°F | Resp 16 | Ht 63.39 in | Wt 153.0 lb

## 2018-05-03 DIAGNOSIS — E7800 Pure hypercholesterolemia, unspecified: Secondary | ICD-10-CM

## 2018-05-03 DIAGNOSIS — F411 Generalized anxiety disorder: Secondary | ICD-10-CM

## 2018-05-03 DIAGNOSIS — Z1383 Encounter for screening for respiratory disorder NEC: Secondary | ICD-10-CM

## 2018-05-03 DIAGNOSIS — I10 Essential (primary) hypertension: Secondary | ICD-10-CM | POA: Diagnosis not present

## 2018-05-03 DIAGNOSIS — Z1389 Encounter for screening for other disorder: Secondary | ICD-10-CM

## 2018-05-03 DIAGNOSIS — N941 Unspecified dyspareunia: Secondary | ICD-10-CM | POA: Diagnosis not present

## 2018-05-03 DIAGNOSIS — Z136 Encounter for screening for cardiovascular disorders: Secondary | ICD-10-CM | POA: Diagnosis not present

## 2018-05-03 DIAGNOSIS — K5909 Other constipation: Secondary | ICD-10-CM

## 2018-05-03 DIAGNOSIS — Z0000 Encounter for general adult medical examination without abnormal findings: Secondary | ICD-10-CM | POA: Diagnosis not present

## 2018-05-03 DIAGNOSIS — N952 Postmenopausal atrophic vaginitis: Secondary | ICD-10-CM

## 2018-05-03 DIAGNOSIS — K760 Fatty (change of) liver, not elsewhere classified: Secondary | ICD-10-CM

## 2018-05-03 LAB — POCT URINALYSIS DIP (MANUAL ENTRY)
Bilirubin, UA: NEGATIVE
Blood, UA: NEGATIVE
Glucose, UA: NEGATIVE mg/dL
Ketones, POC UA: NEGATIVE mg/dL
Leukocytes, UA: NEGATIVE
Nitrite, UA: NEGATIVE
Protein Ur, POC: NEGATIVE mg/dL
Spec Grav, UA: <=1.005 — AB (ref 1.010–1.025)
Urobilinogen, UA: 0.2 E.U./dL
pH, UA: 6 (ref 5.0–8.0)

## 2018-05-03 MED ORDER — AMLODIPINE BESYLATE 10 MG PO TABS: ORAL_TABLET | ORAL | 3 refills | Status: DC

## 2018-05-03 MED ORDER — DIAZEPAM 5 MG PO TABS: 5.0000 mg | ORAL_TABLET | Freq: Two times a day (BID) | ORAL | 2 refills | Status: DC | PRN

## 2018-05-03 NOTE — Progress Notes (Signed)
Subjective:    Patient ID: Angela Graves, female    DOB: Jul 24, 1956, 62 y.o.   MRN: [id number] Chief Complaint  Patient presents with  . Annual Exam    HPI  Angela Graves is a 62 yo woman here for her full physical. I saw her last yr for her CPE on 02/16/2015  Primary Preventative Screenings: Cervical Cancer: s/p hysterectomy in 1979 for ovarian cyst and endometriosis so does not need any further paps STI screening: neg Hep C 07/11/15 and HIV screen 11/04/17 Breast Cancer: mam at breast center q2 yrs last done 06/16/2017 Colorectal Cancer:does not want colonoscopy, had neg IFOBT 2016, cologuard negative 04/2016 Tobacco use/EtOH/substances:Long h/o tobacco use: 31 yo to 62 yo x 3ppd but does not qualify for screening lung CT as stopped to long ago but pt considered paing out of pocket for screening CT Bone Density: none prior Cardiac: EKG 11/04/2017 Weight/Blood sugar/Diet/Exercise: BMI Readings from Last 3 Encounters:  11/04/17 28.15 kg/m  06/18/17 27.54 kg/m  05/20/17 27.72 kg/m   No results found for: HGBA1C OTC/Vit/Supp/Herbal:Vit D borderline low at 29 3 yrs ago but low normal in 30s for the past 2 years. Still taking 1000u vit D but no calcium supplement Dentist/Optho: lots of dental work recently - has temporary bridge and currently awaiting placement of permanent, wears glasses so sees eye doctor regularly. Immunizations: has had shingles prior and would rather have that again than the vaccine Immunization History  Administered Date(s) Administered  . Tdap 04/24/2016    Chronic Medical Conditions: HTN: Situational elevated blood pressure after driving. Recently started on BP med 04/2017 - now tolerating amlodipine 10 very well (titrated up after failing initial trial of hctz 25 followed by triamterene-hctz 37.5-25). BP nml at home 110/70s. No pedal edema or orthostatic sxs   Chronic constipation: controlled by daily miralax  Anxiety: no longer having any problems now that  she is maintained on valium 1/2 tab qam - uses again during the day very rarely.  She has been on valium for decades and prior PCP tried her off of it but she was having SHoB or substernal diaphragmatic spasms with pain into both sides of her chest and back lasting for 30 min to a few hrs and occurring a times every few wk - resolved on low dose valium qam.  Elevated LFTs: US showed fatty liver.  HLD: varies widely with diet  Dyspareunia: Has intercourse VERY rarely as husband has ED and the PDEs often don't work but when the do sex is very painful and unpleasent for her. She has no discharge, no other sxs, no perineum/vaginal/labial changes, no discharge, itching, or pain otherwise.  Has not had any pelvic exam in decades since her full hysterectomy w/ BSO in her 45s (7902) for endometriosis and ovarian cysts. Has absolutely no interest in ever using any type of hormone due to the risks - even topical vaginal.  Is interested in a new medication she saw a magazine add for.  Past Medical History:  Diagnosis Date  . Migraine    Past Surgical History:  Procedure Laterality Date  . ABDOMINAL HYSTERECTOMY    . CHOLECYSTECTOMY    . JOINT REPLACEMENT     Current Outpatient Medications on File Prior to Visit  Medication Sig Dispense Refill  . amLODipine (NORVASC) 10 MG tablet TAKE 1 TABLET(10 MG) BY MOUTH DAILY 90 tablet 3  . cholecalciferol (VITAMIN D) 1000 units tablet Take 1,000 Units by mouth daily.    . diazepam (VALIUM)  5 MG tablet TAKE 1 TABLET BY MOUTH EVERY 12 HOURS AS NEEDED FOR ANXIETY 60 tablet 2  . promethazine (PHENERGAN) 25 MG tablet Take 1 tablet (25 mg total) by mouth every 8 (eight) hours as needed for nausea. 30 tablet 1   No current facility-administered medications on file prior to visit.    Allergies  Allergen Reactions  . Codeine Itching  . Morphine And Related Other (See Comments)    Hyperactive   Family History  Adopted: Yes  Family history unknown: Yes   Social  History   Socioeconomic History  . Marital status: Married    Spouse name: Not on file  . Number of children: Not on file  . Years of education: Not on file  . Highest education level: Not on file  Occupational History  . Not on file  Social Needs  . Financial resource strain: Not on file  . Food insecurity:    Worry: Not on file    Inability: Not on file  . Transportation needs:    Medical: Not on file    Non-medical: Not on file  Tobacco Use  . Smoking status: Former Research scientist (life sciences)  . Smokeless tobacco: Former Network engineer and Sexual Activity  . Alcohol use: Yes    Alcohol/week: 5.0 standard drinks    Types: 5 Glasses of wine per week    Comment: socially  . Drug use: No  . Sexual activity: Never    Birth control/protection: Abstinence  Lifestyle  . Physical activity:    Days per week: Not on file    Minutes per session: Not on file  . Stress: Not on file  Relationships  . Social connections:    Talks on phone: Not on file    Gets together: Not on file    Attends religious service: Not on file    Active member of club or organization: Not on file    Attends meetings of clubs or organizations: Not on file    Relationship status: Not on file  Other Topics Concern  . Not on file  Social History Narrative  . Not on file   Depression screen [facility] 2/9 11/04/2017 06/18/2017 05/20/2017 04/30/2017 01/12/2017  Decreased Interest 0 0 0 0 0  Down, Depressed, Hopeless 0 0 0 0 0  PHQ - 2 Score 0 0 0 0 0     Primary Preventative screening: doing a lot of anti-inflammatory foods  Phenergan at home for migraines and motion sickness with flying - travels 2-3x/yr which induces migraines  Review of Systems  HENT: Positive for dental problem.   Genitourinary: Positive for dyspareunia.  Musculoskeletal: Positive for arthralgias and back pain.  All other systems reviewed and are negative.      Objective:   Physical Exam  Constitutional: She is oriented to person, place, and time.  She appears well-developed and well-nourished. No distress.  HENT:  Head: Normocephalic and atraumatic.  Right Ear: Tympanic membrane, external ear and ear canal normal.  Left Ear: Tympanic membrane, external ear and ear canal normal.  Nose: Mucosal edema present. No rhinorrhea.  Mouth/Throat: Uvula is midline, oropharynx is clear and moist and mucous membranes are normal. No posterior oropharyngeal erythema.  Eyes: Pupils are equal, round, and reactive to light. Conjunctivae and EOM are normal. Right eye exhibits no discharge. Left eye exhibits no discharge. No scleral icterus.  Neck: Normal range of motion. Neck supple. No thyromegaly present.  Cardiovascular: Normal rate, regular rhythm, normal heart sounds and intact distal  pulses.  Pulmonary/Chest: Effort normal and breath sounds normal. No respiratory distress. No breast tenderness, discharge or bleeding.  Abdominal: Soft. Bowel sounds are normal. She exhibits no distension, no pulsatile liver and no fluid wave. There is hepatomegaly. There is no splenomegaly. There is no tenderness.  Genitourinary: No breast tenderness, discharge or bleeding. There is labial fusion. There is no rash, tenderness or lesion on the right labia. There is no rash, tenderness or lesion on the left labia. Right adnexum displays no mass and no tenderness. Left adnexum displays no mass and no tenderness. There is erythema and tenderness in the vagina. No bleeding in the vagina. No vaginal discharge found.  Genitourinary Comments: Cervix, uterus, and ovaries surgically absent. Pt with mildly stenotic vaginal introitus so declines speculum exam but hesitantly consents  Bimanual exam performed, mildly uncomfortable due to mild mucosal inflammation from postmenopausal atrophic vagnitis and mildly stenotic introitus  Musculoskeletal: She exhibits no edema.  Lymphadenopathy:       Head (right side): Tonsillar adenopathy present. No submandibular adenopathy present.        Head (left side): No submandibular and no tonsillar adenopathy present.    She has cervical adenopathy.    She has no axillary adenopathy.       Right: No supraclavicular adenopathy present.       Left: No supraclavicular adenopathy present.  Neurological: She is alert and oriented to person, place, and time. She has normal reflexes.  Skin: Skin is warm and dry. She is not diaphoretic. No erythema.  Psychiatric: She has a normal mood and affect. Her behavior is normal.   BP 116/70   Pulse 80   Temp 98 F (36.7 C) (Oral)   Resp 16   Ht 5' 3.39" (1.61 m)   Wt 153 lb (69.4 kg)   SpO2 95%   BMI 26.77 kg/m      Assessment & Plan:   1. Annual physical exam   2. Screening for cardiovascular, respiratory, and genitourinary diseases   3. Essential hypertension - doing great on amlodipine 10 - cont  4. Pure hypercholesterolemia - lipids today increased - 10 yrs ASCVD risk 6.6% with optimal risk of 3.2%.  However, when we take into account last yrs lipid panel that was better (LDL was 133, non-HDL 181 which has gone up this yr to LDL 168, non-HDL 224) her BP is so much better on medication this yr (had not started last yr so was high) that it actually decreased her 10 yr ASCVD risk score to 3.3% with an optimal risk of 1.7% so work on TLC. Consider checking HS-CRP for additional info in future.  5. Fatty liver - LFTs consistently but slowly worsening since last [place] was 3 yrs ago which showed mild fatty liver but LFTs newly/barely bumped then - consider repeating US?  6. Chronic constipation   7. Anxiety state   8. Dyspareunia in female   69. Postmenopausal atrophic vaginitis - interested in new daily non-hormonal med [place] - informed pt that I was not familiar w/ this med at all (beyond also seeing the adds) so would be best to send her to a gynecologist to discuss - pt declines but requests that I look into this to see if it might be a possibility for her so we cna discuss it further. No  interest in trying topical estrogen.   If LFTs still elevated consider repeating RUQ abd [place] If lipids still elevated, consider HS-CRP Recheck right tonsillar node at next OV. Discuss  shingrix at f/u   Orders Placed This Encounter  Procedures  . Wet Prep for [employer], Clue  . Comprehensive metabolic panel    Order Specific Question:   Has the patient fasted?    Answer:   Yes  . CBC with Differential/Platelet  . Lipid panel    Order Specific Question:   Has the patient fasted?    Answer:   Yes  . TSH  . POCT urinalysis dipstick    Meds ordered this encounter  Medications  . diazepam (VALIUM) 5 MG tablet    Sig: Take 1 tablet (5 mg total) by mouth every 12 (twelve) hours as needed. for anxiety    Dispense:  60 tablet    Refill:  2  . amLODipine (NORVASC) 10 MG tablet    Sig: TAKE 1 TABLET(10 MG) BY MOUTH DAILY    Dispense:  90 tablet    Refill:  3      [name], M.D.  Primary Care at [facility] [address] [place], [place] [address] [phone] phone [phone] fax  05/03/18 8:36 AM

## 2018-05-03 NOTE — Patient Instructions (Addendum)
If you have lab work done today you will be contacted with your lab results within the next 2 weeks.  If you have not heard from [place] then please contact us. The fastest way to get your results is to register for My Chart.   IF you received an x-ray today, you will receive an invoice from [facility]. Please contact [facility] at [phone] with questions or concerns regarding your invoice.   IF you received labwork today, you will receive an invoice from [facility]. Please contact [facility] at [phone] with questions or concerns regarding your invoice.   Our billing staff will not be able to assist you with questions regarding bills from these companies.  You will be contacted with the lab results as soon as they are available. The fastest way to get your results is to activate your My Chart account. Instructions are located on the last page of this paperwork. If you have not heard from [place] regarding the results in 2 weeks, please contact this office.     Atrophic Vaginitis Atrophic vaginitis is a condition in which the tissues that line the vagina become dry and thin. This condition is most common in women who have stopped having regular menstrual periods (menopause). This usually starts when a woman is 54-80 years old. Estrogen helps to keep the vagina moist. It stimulates the vagina to produce a clear fluid that lubricates the vagina for sexual intercourse. This fluid also protects the vagina from infection. Lack of estrogen can cause the lining of the vagina to get thinner and dryer. The vagina may also shrink in size. It may become less elastic. Atrophic vaginitis tends to get worse over time as a woman's estrogen level drops. What are the causes? This condition is caused by the normal drop in estrogen that happens around the time of menopause. What increases the risk? Certain conditions or situations may lower a woman's estrogen level, which increases her risk of  atrophic vaginitis. These include:  Taking medicine that blocks estrogen.  Having ovaries removed surgically.  Being treated for cancer with X-ray treatment (radiation) or medicines (chemotherapy).  Exercising very hard and often.  Having an eating disorder (anorexia).  Giving birth or breastfeeding.  Being over the age of 44.  Smoking.  What are the signs or symptoms? Symptoms of this condition include:  Pain, soreness, or bleeding during sexual intercourse (dyspareunia).  Vaginal burning, irritation, or itching.  Pain or bleeding during a vaginal examination using a speculum (pelvic exam).  Loss of interest in sexual activity.  Having burning pain when passing urine.  Vaginal discharge that is brown or yellow.  In some cases, there are no symptoms. How is this diagnosed? This condition is diagnosed with a medical history and physical exam. This will include a pelvic exam that checks whether the inside of your vagina appears pale, thin, or dry. Rarely, you may also have other tests, including:  A urine test.  A test that checks the acid balance in your vaginal fluid (acid balance test).  How is this treated? Treatment for this condition may depend on the severity of your symptoms. Treatment may include:  Using an over-the-counter vaginal lubricant before you have sexual intercourse.  Using a long-acting vaginal moisturizer.  Using low-dose vaginal estrogen for moderate to severe symptoms that do not respond to other treatments. Options include creams, tablets, and inserts (vaginal rings). Before using vaginal estrogen, tell your health care provider if you have a history of: ?  Breast cancer. ? Endometrial cancer. ? Blood clots.  Taking medicines. You may be able to take a daily pill for dyspareunia. Discuss all of the risks of this medicine with your health care provider. It is usually not recommended for women who have a family history or personal history of  breast cancer.  If your symptoms are very mild and you are not sexually active, you may not need treatment. Follow these instructions at home:  Take medicines only as directed by your health care provider. Do not use herbal or alternative medicines unless your health care provider says that you can.  Use over-the-counter creams, lubricants, or moisturizers for dryness only as directed by your health care provider.  If your atrophic vaginitis is caused by menopause, discuss all of your menopausal symptoms and treatment options with your health care provider.  Do not douche.  Do not use products that can make your vagina dry. These include: ? Scented feminine sprays. ? Scented tampons. ? Scented soaps.  If it hurts to have sex, talk with your sexual partner. Contact a health care provider if:  Your discharge looks different than normal.  Your vagina has an unusual smell.  You have new symptoms.  Your symptoms do not improve with treatment.  Your symptoms get worse. This information is not intended to replace advice given to you by your health care provider. Make sure you discuss any questions you have with your health care provider. Document Released: 01/16/2015 Document Revised: 02/07/2016 Document Reviewed: 08/23/2014 Elsevier Interactive Patient Education  2018 [employer].  Dyspareunia, Female Dyspareunia is pain that is associated with sexual activity. This can affect any part of the genitals or lower abdomen, and there are many possible causes. This condition ranges from mild to severe. Depending on the cause, dyspareunia may get better with treatment, or it may return (recur) over time. What are the causes? The cause of this condition is not always known. Possible causes include:  Cancer.  Psychological factors, such as depression, anxiety, or previous traumatic experiences.  Severe pain and tenderness of the skin around the vagina (vulva) when it is touched (vulvar  vestibulitis syndrome).  Infection of the pelvis or the vulva.  Infection of the vagina.  Painful, involuntary tightening (contraction) of the vaginal muscles when anything is put inside the vagina (vaginismus).  Allergic reaction.  Ovarian cysts.  Solid growths of tissue (tumors) in the ovaries or the uterus.  Scar tissue in the ovaries, vagina, or pelvis.  Vaginal dryness.  Thinning of the tissue (atrophy) of the vulva and vagina.  Skin conditions that affect the vulva (vulvar dermatoses), such as lichen sclerosus or lichen planus.  Endometriosis.  Tubal pregnancy.  A tilted uterus.  Uterine prolapse.  Adhesions in the vagina.  Bladder problems.  Intestinal problems.  Certain medicines.  Medical conditions such as diabetes, arthritis, or thyroid disease.  What increases the risk? The following factors may make you more likely to develop this condition:  Having experienced physical or sexual trauma.  Having given birth more than once.  Taking birth control pills.  Having gone through menopause.  Having recently given birth, typically within the past 3-6 months.  Breastfeeding.  What are the signs or symptoms? The main symptom of this condition is pain in any part of the genitals or lower abdomen during or after sexual activity. This may include pain during sexual arousal, genital stimulation, or orgasm. Pain may get worse when anything is inserted into the vagina, or when the  genitals are touched in any way, such as when sitting or wearing pants. Pain can range from mild to severe, depending on the cause of the condition. In some cases, symptoms go away with treatment and return (recur) at a later date. How is this diagnosed? This condition may be diagnosed based on:  Your symptoms, including: ? Where your pain is located. ? When your pain occurs.  Your medical history.  A physical exam. This may include a pelvic exam and a Pap test. This is a  screening test that is used to check for signs of cancer of the vagina, cervix, and uterus.  Tests, including: ? Blood tests. ? Ultrasound. This uses sound waves to make a picture of the area that is being tested. ? Urine culture. This test involves checking a urine sample for signs of infection. ? Culture test. This is when your health care provider uses a swab to collect a sample of vaginal fluid. The sample is checked for signs of infection. ? X-rays. ? MRI. ? CT scan. ? Laparoscopy. This is a procedure in which a small incision is made in your lower abdomen and a lighted, pencil-sized instrument (laparoscope) is passed through the incision and used to look inside your pelvis.  You may be referred to a health care provider who specializes in women's health (gynecologist). In some cases, diagnosing the cause of dyspareunia can be difficult. How is this treated? Treatment depends on the cause of your condition and your symptoms. In most cases, you may need to stop sexual activity until your symptoms improve. Treatment may include:  Lubricants.  Kegel exercises or vaginal dilators.  Medicated skin creams.  Medicated vaginal creams.  Hormonal therapy.  Antibiotic medicine to prevent or fight infection.  Medicines that help to relieve pain.  Medicines that treat depression (antidepressants).  Psychological counseling.  Sex therapy.  Surgery.  Follow these instructions at home: Lifestyle  Avoid tight clothing and irritating materials around your genital and abdominal area.  Use water-based lubricants as needed. Avoid oil-based lubricants.  Do not use any products that irritate you. This may include certain condoms, spermicides, lubricants, soaps, tampons, vaginal sprays, or douches.  Always practice safe sex. Talk with your health care provider about which form of birth control (contraception) is best for you.  Maintain open communication with your sexual  partner. General instructions  Take over-the-counter and prescription medicines only as told by your health care provider.  If you had tests done, it is your responsibility to get your tests results. Ask your health care provider or the department performing the test when your results will be ready.  Urinate before you engage in sexual activity.  Consider joining a support group.  Keep all follow-up visits as told by your health care provider. This is important. Contact a health care provider if:  You develop vaginal bleeding after sexual intercourse.  You develop a lump at the opening of your vagina. Seek medical care even if the lump is painless.  You have: ? Abnormal vaginal discharge. ? Vaginal dryness. ? Itchiness or irritation of your vulva or vagina. ? A new rash. ? Symptoms that get worse or do not improve with treatment. ? A fever. ? Pain when you urinate. ? Blood in your urine. Get help right away if:  You develop severe pain in your abdomen during or shortly after sexual intercourse.  You pass out after having sexual intercourse. This information is not intended to replace advice given to you by  your health care provider. Make sure you discuss any questions you have with your health care provider. Document Released: 09/21/2007 Document Revised: 01/11/2016 Document Reviewed: 04/03/2015 Elsevier Interactive Patient Education  [employer].

## 2018-05-04 LAB — COMPREHENSIVE METABOLIC PANEL
ALT: 90 IU/L — ABNORMAL HIGH (ref 0–32)
AST: 61 IU/L — ABNORMAL HIGH (ref 0–40)
Albumin/Globulin Ratio: 2 (ref 1.2–2.2)
Albumin: 5 g/dL — ABNORMAL HIGH (ref 3.6–4.8)
Alkaline Phosphatase: 117 IU/L (ref 39–117)
BUN/Creatinine Ratio: 15 (ref 12–28)
BUN: 13 mg/dL (ref 8–27)
Bilirubin Total: 0.7 mg/dL (ref 0.0–1.2)
CO2: 23 mmol/L (ref 20–29)
Calcium: 9.6 mg/dL (ref 8.7–10.3)
Chloride: 97 mmol/L (ref 96–106)
Creatinine, Ser: 0.88 mg/dL (ref 0.57–1.00)
GFR calc Af Amer: 81 mL/min/{1.73_m2} (ref >59)
GFR calc non Af Amer: 70 mL/min/{1.73_m2} (ref >59)
Globulin, Total: 2.5 g/dL (ref 1.5–4.5)
Glucose: 105 mg/dL — ABNORMAL HIGH (ref 65–99)
Potassium: 4.4 mmol/L (ref 3.5–5.2)
Sodium: 137 mmol/L (ref 134–144)
Total Protein: 7.5 g/dL (ref 6.0–8.5)

## 2018-05-04 LAB — CBC WITH DIFFERENTIAL/PLATELET
Basophils Absolute: 0.1 10*3/uL (ref 0.0–0.2)
Basos: 2 %
EOS (ABSOLUTE): 0.2 10*3/uL (ref 0.0–0.4)
Eos: 4 %
Hematocrit: 44.3 % (ref 34.0–46.6)
Hemoglobin: 15.2 g/dL (ref 11.1–15.9)
Immature Grans (Abs): 0 10*3/uL (ref 0.0–0.1)
Immature Granulocytes: 0 %
Lymphocytes Absolute: 1.7 10*3/uL (ref 0.7–3.1)
Lymphs: 46 %
MCH: 30.6 pg (ref 26.6–33.0)
MCHC: 34.3 g/dL (ref 31.5–35.7)
MCV: 89 fL (ref 79–97)
Monocytes Absolute: 0.3 10*3/uL (ref 0.1–0.9)
Monocytes: 8 %
Neutrophils Absolute: 1.5 10*3/uL (ref 1.4–7.0)
Neutrophils: 40 %
Platelets: 202 10*3/uL (ref 150–450)
RBC: 4.96 x10E6/uL (ref 3.77–5.28)
RDW: 12.3 % (ref 12.3–15.4)
WBC: 3.8 10*3/uL (ref 3.4–10.8)

## 2018-05-04 LAB — WET PREP FOR TRICH, YEAST, CLUE
Clue Cell Exam: NEGATIVE
Trichomonas Exam: NEGATIVE
Yeast Exam: NEGATIVE

## 2018-05-04 LAB — LIPID PANEL
Chol/HDL Ratio: 6 ratio — ABNORMAL HIGH (ref 0.0–4.4)
Cholesterol, Total: 269 mg/dL — ABNORMAL HIGH (ref 100–199)
HDL: 45 mg/dL (ref >39)
LDL Calculated: 168 mg/dL — ABNORMAL HIGH (ref 0–99)
Triglycerides: 280 mg/dL — ABNORMAL HIGH (ref 0–149)
VLDL Cholesterol Cal: 56 mg/dL — ABNORMAL HIGH (ref 5–40)

## 2018-05-04 LAB — TSH: TSH: 1.9 u[IU]/mL (ref 0.450–4.500)

## 2018-05-05 ENCOUNTER — Encounter: Payer: Self-pay | Admitting: Family Medicine

## 2018-05-12 ENCOUNTER — Other Ambulatory Visit: Payer: Self-pay | Admitting: Family Medicine

## 2018-05-12 ENCOUNTER — Encounter: Payer: Self-pay | Admitting: Family Medicine

## 2018-05-12 DIAGNOSIS — Z1231 Encounter for screening mammogram for malignant neoplasm of breast: Secondary | ICD-10-CM

## 2018-05-21 NOTE — Telephone Encounter (Signed)
Copied from [place] [id number]. Topic: Quick Communication - See Telephone Encounter >> May 21, 2018  1:21 PM Antonieta Iba C wrote: CRM for notification. See Telephone encounter for: 05/21/18.  Pt called in to follow up on her mychart message. Advised pt that provider is out of the office. Pt says that she wish that the provider would have made her aware of that so that she is not waiting for her response. Pt would like to know if office has another mean to contact provider while she is out of the office.   Pt says that she would like a response by the end of the day if possible.

## 2018-05-24 ENCOUNTER — Encounter: Payer: Self-pay | Admitting: Family Medicine

## 2018-05-26 ENCOUNTER — Telehealth: Payer: Self-pay | Admitting: Family Medicine

## 2018-05-26 NOTE — Telephone Encounter (Signed)
Left message for patient to call office. Office will offer another provider for her appointment to be able to be seen before she leaves country.

## 2018-06-17 ENCOUNTER — Encounter: Payer: Self-pay | Admitting: Family Medicine

## 2018-06-21 ENCOUNTER — Ambulatory Visit: Payer: BLUE CROSS/BLUE SHIELD

## 2018-06-24 NOTE — Telephone Encounter (Signed)
Pt calling back to ask why another dr cannot prescribe the intrarosa.  Advised pt she may need to see another dr to get that med.  Pt declined and states she has already seen Dr Brigitte Pulse, and does not understand why this cannot happen. Pt would like a call back asap.  [phone]

## 2018-06-28 ENCOUNTER — Ambulatory Visit
Admission: RE | Admit: 2018-06-28 | Discharge: 2018-06-28 | Disposition: A | Discharge status: Home / Self Care | Payer: BLUE CROSS/BLUE SHIELD | Source: Ambulatory Visit | Attending: Family Medicine | Admitting: Family Medicine

## 2018-06-28 DIAGNOSIS — Z1231 Encounter for screening mammogram for malignant neoplasm of breast: Secondary | ICD-10-CM

## 2018-07-14 ENCOUNTER — Encounter: Payer: Self-pay | Admitting: Family Medicine

## 2018-07-14 MED ORDER — PRASTERONE 6.5 MG VA INST: 6.5000 mg | VAGINAL_INSERT | Freq: Every day | VAGINAL | 5 refills | Status: DC

## 2018-07-14 NOTE — Telephone Encounter (Signed)
Gave call to clinical / clinical will rtex Dr.Shaw regarding Intrarosa medication refill

## 2018-07-14 NOTE — Telephone Encounter (Signed)
Thanks for alerting me - I had a mychart message w/ this info from August to pt that for some reason didn't go through so resent message and called pt. Called in med to her pharmacy.

## 2018-07-29 ENCOUNTER — Encounter: Payer: Self-pay | Admitting: Family Medicine

## 2018-08-05 ENCOUNTER — Telehealth: Payer: Self-pay | Admitting: Family Medicine

## 2018-08-05 NOTE — Telephone Encounter (Signed)
Tried to call pt to reschedule her appt with Dr. Brigitte Pulse on 11/08/18. Due to Shaw's change in schedule, she will be unavailable that day. Spoke with pt's husband and he will have her call and reschedule. When pt calls back, please reschedule at her convenience. Thank you!

## 2018-08-20 ENCOUNTER — Other Ambulatory Visit: Payer: Self-pay | Admitting: Family Medicine

## 2018-08-20 NOTE — Telephone Encounter (Signed)
Requested medication (s) are due for refill today: yes  Requested medication (s) are on the active medication list: yes  Last refill:  05/03/18 #60 2 RF  Future visit scheduled: yes  Notes to clinic:  Controlled substance   Requested Prescriptions  Pending Prescriptions Disp Refills   diazepam (VALIUM) 5 MG tablet [Pharmacy Med Name: DIAZEPAM 5MG  TABLETS] 60 tablet 0    Sig: TAKE 1 TABLET(5 MG) BY MOUTH EVERY 12 HOURS AS NEEDED FOR ANXIETY     Not Delegated - Psychiatry:  Anxiolytics/Hypnotics Failed - 08/20/2018  3:11 AM      Failed - This refill cannot be delegated      Failed - Urine Drug Screen completed in last 360 days.      Passed - Valid encounter within last 6 months    Recent Outpatient Visits          3 months ago Annual physical exam   [facility], Laurey Arrow, MD   9 months ago Essential hypertension   [facility], Laurey Arrow, MD   1 year ago Essential hypertension   [facility], Laurey Arrow, MD   1 year ago Essential hypertension   [facility], Laurey Arrow, MD   1 year ago Essential hypertension   [facility], Laurey Arrow, MD      Future Appointments            In 1 month Brigitte Pulse Laurey Arrow, MD [facility] [place]

## 2018-08-23 NOTE — Telephone Encounter (Signed)
Patient is requesting a refill of the following medications: Requested Prescriptions   Pending Prescriptions Disp Refills  . diazepam (VALIUM) 5 MG tablet [Pharmacy Med Name: DIAZEPAM 5MG  TABLETS] 60 tablet 0    Sig: TAKE 1 TABLET(5 MG) BY MOUTH EVERY 12 HOURS AS NEEDED FOR ANXIETY

## 2018-08-26 ENCOUNTER — Telehealth: Payer: Self-pay | Admitting: Family Medicine

## 2018-08-26 DIAGNOSIS — R7301 Impaired fasting glucose: Secondary | ICD-10-CM

## 2018-08-26 DIAGNOSIS — E7800 Pure hypercholesterolemia, unspecified: Secondary | ICD-10-CM

## 2018-08-26 DIAGNOSIS — K760 Fatty (change of) liver, not elsewhere classified: Secondary | ICD-10-CM

## 2018-08-26 NOTE — Telephone Encounter (Signed)
Copied from [place] [id number]. Topic: Appointment Scheduling - Scheduling Inquiry for Clinic >> Aug 26, 2018  8:41 AM Reyne Dumas L wrote: Reason for CRM:   Pt is scheduled for a 6 month follow up on 09/29/18.  Pt wants to know if she will need to be fasting for bloodwork at that appointment. Pt can be reached at [phone]

## 2018-08-28 NOTE — Telephone Encounter (Signed)
Spoke to pt and advised  - yes, come fasting. Appt is at 1pm.  Pt will come in the am.  Message sent to Dr. Brigitte Pulse to place orders.

## 2018-08-28 NOTE — Telephone Encounter (Signed)
Order entered

## 2018-08-28 NOTE — Telephone Encounter (Signed)
Refill sent in

## 2018-09-07 ENCOUNTER — Ambulatory Visit: Payer: BLUE CROSS/BLUE SHIELD | Admitting: Family Medicine

## 2018-09-07 ENCOUNTER — Encounter

## 2018-09-27 ENCOUNTER — Telehealth: Payer: Self-pay | Admitting: Family Medicine

## 2018-09-27 NOTE — Telephone Encounter (Signed)
LVM for pt to call the office and reschedule the appt with Dr. Brigitte Pulse. Dr. Brigitte Pulse is out of the office with an injury for the next 6 weeks. Best case scenario, she will be returning the first week of March. When pt calls back, please either reschedule Dr. Brigitte Pulse for the first week of March (or after) or with a different provider if the appt needs to happen sooner. Thank you!

## 2018-09-29 ENCOUNTER — Ambulatory Visit: Payer: BLUE CROSS/BLUE SHIELD | Admitting: Family Medicine

## 2018-10-18 ENCOUNTER — Ambulatory Visit: Payer: BLUE CROSS/BLUE SHIELD | Admitting: Family Medicine

## 2018-11-08 ENCOUNTER — Ambulatory Visit: Payer: BLUE CROSS/BLUE SHIELD | Admitting: Family Medicine

## 2018-11-11 ENCOUNTER — Other Ambulatory Visit: Payer: Self-pay

## 2018-11-11 ENCOUNTER — Ambulatory Visit (INDEPENDENT_AMBULATORY_CARE_PROVIDER_SITE_OTHER): Payer: BLUE CROSS/BLUE SHIELD | Admitting: Family Medicine

## 2018-11-11 ENCOUNTER — Encounter: Payer: Self-pay | Admitting: Family Medicine

## 2018-11-11 DIAGNOSIS — K760 Fatty (change of) liver, not elsewhere classified: Secondary | ICD-10-CM | POA: Diagnosis not present

## 2018-11-11 DIAGNOSIS — R110 Nausea: Secondary | ICD-10-CM

## 2018-11-11 DIAGNOSIS — E7800 Pure hypercholesterolemia, unspecified: Secondary | ICD-10-CM | POA: Diagnosis not present

## 2018-11-11 DIAGNOSIS — R7301 Impaired fasting glucose: Secondary | ICD-10-CM

## 2018-11-11 DIAGNOSIS — R1013 Epigastric pain: Secondary | ICD-10-CM | POA: Diagnosis not present

## 2018-11-11 MED ORDER — PROMETHAZINE HCL 25 MG PO TABS: 25.0000 mg | ORAL_TABLET | Freq: Three times a day (TID) | ORAL | 1 refills | Status: DC | PRN

## 2018-11-11 MED ORDER — DIAZEPAM 5 MG PO TABS: ORAL_TABLET | ORAL | 5 refills | Status: DC

## 2018-11-11 MED ORDER — AMLODIPINE BESYLATE 10 MG PO TABS: ORAL_TABLET | ORAL | 3 refills | Status: DC

## 2018-11-11 NOTE — Progress Notes (Signed)
Subjective:    Patient: Angela Graves  DOB: 1956-05-28; 63 y.o.   MRN: [id number]  Chief Complaint  Patient presents with  . Chronic Conditions    follow-up     HPI   Has had a rough year with URI. Still having plain in feet and ankle from the fall. Going to reflexology  No edema from amlodipine Not checking BP as much  Did try the prasterone   Wonders about being tested for [facility] History Past Medical History:  Diagnosis Date  . Anxiety   . Migraine    Past Surgical History:  Procedure Laterality Date  . ABDOMINAL HYSTERECTOMY Bilateral 1979   complete with BSO due to endometriosis and ovarian cysts  . APPENDECTOMY    . CHOLECYSTECTOMY    . JOINT REPLACEMENT    . KNEE SURGERY     Current Outpatient Medications on File Prior to Visit  Medication Sig Dispense Refill  . amLODipine (NORVASC) 10 MG tablet TAKE 1 TABLET(10 MG) BY MOUTH DAILY 90 tablet 3  . cholecalciferol (VITAMIN D) 1000 units tablet Take 1,000 Units by mouth daily.    . diazepam (VALIUM) 5 MG tablet TAKE 1 TABLET(5 MG) BY MOUTH EVERY 12 HOURS AS NEEDED FOR ANXIETY 60 tablet 2  . Prasterone (INTRAROSA) 6.5 MG INST Place 6.5 mg vaginally at bedtime. 30 each 5  . promethazine (PHENERGAN) 25 MG tablet Take 1 tablet (25 mg total) by mouth every 8 (eight) hours as needed for nausea. 30 tablet 1   No current facility-administered medications on file prior to visit.    Allergies  Allergen Reactions  . Codeine Itching  . Morphine And Related Other (See Comments)    Hyperactive   Family History  Adopted: Yes  Family history unknown: Yes   Social History   Socioeconomic History  . Marital status: Married    Spouse name: Not on file  . Number of children: 0  . Years of education: Not on file  . Highest education level: High school graduate  Occupational History  . Not on file  Social Needs  . Financial resource strain: Not on file  . Food insecurity:    Worry: Not on file    Inability: Not  on file  . Transportation needs:    Medical: Not on file    Non-medical: Not on file  Tobacco Use  . Smoking status: Former Research scientist (life sciences)  . Smokeless tobacco: Former Network engineer and Sexual Activity  . Alcohol use: Yes    Alcohol/week: 7.0 standard drinks    Types: 5 Glasses of wine, 2 Standard drinks or equivalent per week    Comment: socially  . Drug use: No  . Sexual activity: Not Currently    Birth control/protection: Post-menopausal, Surgical    Comment: s/p complete hysterectomy with BSO in her 57s  Lifestyle  . Physical activity:    Days per week: Not on file    Minutes per session: Not on file  . Stress: Not on file  Relationships  . Social connections:    Talks on phone: Not on file    Gets together: Not on file    Attends religious service: Not on file    Active member of club or organization: Not on file    Attends meetings of clubs or organizations: Not on file    Relationship status: Not on file  Other Topics Concern  . Not on file  Social History Narrative  . Not on file  Depression screen [facility] 2/9 11/11/2018 05/03/2018 11/04/2017 06/18/2017 05/20/2017  Decreased Interest 0 0 0 0 0  Down, Depressed, Hopeless 0 0 0 0 0  PHQ - 2 Score 0 0 0 0 0    ROS As noted in HPI  Objective:  BP 121/75   Pulse 87   Temp 98 F (36.7 C) (Oral)   Resp 16   Ht 5\' 3"  (1.6 m)   Wt 156 lb (70.8 kg)   SpO2 96%   BMI 27.63 kg/m  Physical Exam Constitutional:      General: She is not in acute distress.    Appearance: She is well-developed. She is not diaphoretic.  HENT:     Head: Normocephalic and atraumatic.     Right Ear: External ear normal.     Left Ear: External ear normal.  Eyes:     General: No scleral icterus.    Conjunctiva/sclera: Conjunctivae normal.  Neck:     Musculoskeletal: Normal range of motion and neck supple.     Thyroid: No thyromegaly.  Cardiovascular:     Rate and Rhythm: Normal rate and regular rhythm.     Heart sounds: Normal heart sounds.    Pulmonary:     Effort: Pulmonary effort is normal. No respiratory distress.     Breath sounds: Normal breath sounds.  Lymphadenopathy:     Cervical: No cervical adenopathy.  Skin:    General: Skin is warm and dry.     Findings: No erythema.  Neurological:     Mental Status: She is alert and oriented to person, place, and time.  Psychiatric:        Behavior: Behavior normal.     [facility] Office Visit on 11/11/2018  Component Date Value Ref Range Status  . Hgb A1c MFr Bld 11/11/2018 5.1  4.8 - 5.6 % Final   Comment:          Prediabetes: 5.7 - 6.4          Diabetes: >6.4          Glycemic control for adults with diabetes: <7.0   . Est. average glucose Bld gHb Est-m* 11/11/2018 100  mg/dL Final  . Cholesterol, Total 11/11/2018 210* 100 - 199 mg/dL Final  . Triglycerides 11/11/2018 206* 0 - 149 mg/dL Final  . HDL 11/11/2018 38* >39 mg/dL Final  . VLDL Cholesterol Cal 11/11/2018 41* 5 - 40 mg/dL Final  . LDL Calculated 11/11/2018 131* 0 - 99 mg/dL Final  . Chol/HDL Ratio 11/11/2018 5.5* 0.0 - 4.4 ratio Final   Comment:                                   T. Chol/HDL Ratio                                             Men  Women                               1/2 Avg.Risk  3.4    3.3                                   Avg.Risk  5.0  4.4                                2X Avg.Risk  9.6    7.1                                3X Avg.Risk 23.4   11.0   . Glucose 11/11/2018 106* 65 - 99 mg/dL Final  . BUN 11/11/2018 15  8 - 27 mg/dL Final  . Creatinine, Ser 11/11/2018 0.72  0.57 - 1.00 mg/dL Final  . GFR calc non Af Amer 11/11/2018 89  >59 mL/min/1.73 Final  . GFR calc Af Amer 11/11/2018 102  >59 mL/min/1.73 Final  . BUN/Creatinine Ratio 11/11/2018 21  12 - 28 Final  . Sodium 11/11/2018 139  134 - 144 mmol/L Final  . Potassium 11/11/2018 4.5  3.5 - 5.2 mmol/L Final  . Chloride 11/11/2018 103  96 - 106 mmol/L Final  . CO2 11/11/2018 22  20 - 29 mmol/L Final  . Calcium 11/11/2018 8.9   8.7 - 10.3 mg/dL Final  . Total Protein 11/11/2018 6.6  6.0 - 8.5 g/dL Final  . Albumin 11/11/2018 4.4  3.8 - 4.8 g/dL Final                 **Please note reference interval change**  . Globulin, Total 11/11/2018 2.2  1.5 - 4.5 g/dL Final  . Albumin/Globulin Ratio 11/11/2018 2.0  1.2 - 2.2 Final  . Bilirubin Total 11/11/2018 0.4  0.0 - 1.2 mg/dL Final  . Alkaline Phosphatase 11/11/2018 102  39 - 117 IU/L Final  . AST 11/11/2018 51* 0 - 40 IU/L Final  . ALT 11/11/2018 71* 0 - 32 IU/L Final  . CRP, High Sensitivity 11/11/2018 5.35* 0.00 - 3.00 mg/L Final   Comment:          Relative Risk for Future Cardiovascular Event                              Low                 <1.00                              Average       1.00 - 3.00                              High                >3.00   . WBC 11/11/2018 3.3* 3.4 - 10.8 x10E3/uL Final  . RBC 11/11/2018 4.72  3.77 - 5.28 x10E6/uL Final  . Hemoglobin 11/11/2018 14.4  11.1 - 15.9 g/dL Final  . Hematocrit 11/11/2018 41.9  34.0 - 46.6 % Final  . MCV 11/11/2018 89  79 - 97 fL Final  . MCH 11/11/2018 30.5  26.6 - 33.0 pg Final  . MCHC 11/11/2018 34.4  31.5 - 35.7 g/dL Final  . RDW 11/11/2018 12.3  11.7 - 15.4 % Final  . Platelets 11/11/2018 185  150 - 450 x10E3/uL Final  . Neutrophils 11/11/2018 41  Not Estab. % Final  . Lymphs 11/11/2018 45  Not Estab. % Final  . Monocytes 11/11/2018 8  Not Estab. % Final  . Eos 11/11/2018 4  Not Estab. % Final  . Basos 11/11/2018 2  Not Estab. % Final  . Neutrophils Absolute 11/11/2018 1.4  1.4 - 7.0 x10E3/uL Final  . Lymphocytes Absolute 11/11/2018 1.5  0.7 - 3.1 x10E3/uL Final  . Monocytes Absolute 11/11/2018 0.3  0.1 - 0.9 x10E3/uL Final  . EOS (ABSOLUTE) 11/11/2018 0.1  0.0 - 0.4 x10E3/uL Final  . Basophils Absolute 11/11/2018 0.1  0.0 - 0.2 x10E3/uL Final  . Immature Granulocytes 11/11/2018 0  Not Estab. % Final  . Immature Grans (Abs) 11/11/2018 0.0  0.0 - 0.1 x10E3/uL Final     Assessment & Plan:    1. Elevated fasting glucose   2. Pure hypercholesterolemia   3. Fatty liver   4. Epigastric pain   5. Nausea     Patient will continue on current chronic medications other than changes noted above, so ok to refill when needed.   See after visit summary for patient specific instructions.  Orders Placed This Encounter  Procedures  . CBC with Differential/Platelet    Meds ordered this encounter  Medications  . amLODipine (NORVASC) 10 MG tablet    Sig: TAKE 1 TABLET(10 MG) BY MOUTH DAILY    Dispense:  90 tablet    Refill:  3  . diazepam (VALIUM) 5 MG tablet    Sig: TAKE 1 TABLET(5 MG) BY MOUTH EVERY 12 HOURS AS NEEDED FOR ANXIETY    Dispense:  60 tablet    Refill:  5  . promethazine (PHENERGAN) 25 MG tablet    Sig: Take 1 tablet (25 mg total) by mouth every 8 (eight) hours as needed for nausea (or migraine).    Dispense:  30 tablet    Refill:  1    Patient verbalized to me that they understand the following: diagnosis, what is being done for them, what to expect and what should be done at home.  Their questions have been answered. They understand that I am unable to predict every possible medication interaction or adverse outcome and that if any unexpected symptoms arise, they should contact us and their pharmacist, as well as never hesitate to seek urgent/emergent care at [facility] or ER if they think it might be warranted.    [name], MD, MPH [facility] at [place] [address] [place], [place]  [phone] Office phone  [phone] Office fax  11/11/18 3:51 PM

## 2018-11-11 NOTE — Patient Instructions (Signed)
° ° ° °  If you have lab work done today you will be contacted with your lab results within the next 2 weeks.  If you have not heard from us then please contact us. The fastest way to get your results is to register for My Chart. ° ° °IF you received an x-ray today, you will receive an invoice from [facility]. Please contact [facility] at [phone] with questions or concerns regarding your invoice.  ° °IF you received labwork today, you will receive an invoice from [facility]. Please contact [facility] at [phone] with questions or concerns regarding your invoice.  ° °Our billing staff will not be able to assist you with questions regarding bills from these companies. ° °You will be contacted with the lab results as soon as they are available. The fastest way to get your results is to activate your My Chart account. Instructions are located on the last page of this paperwork. If you have not heard from us regarding the results in 2 weeks, please contact this office. °  ° ° ° °

## 2018-11-12 LAB — CBC WITH DIFFERENTIAL/PLATELET
Basophils Absolute: 0.1 10*3/uL (ref 0.0–0.2)
Basos: 2 %
EOS (ABSOLUTE): 0.1 10*3/uL (ref 0.0–0.4)
Eos: 4 %
Hematocrit: 41.9 % (ref 34.0–46.6)
Hemoglobin: 14.4 g/dL (ref 11.1–15.9)
Immature Grans (Abs): 0 10*3/uL (ref 0.0–0.1)
Immature Granulocytes: 0 %
Lymphocytes Absolute: 1.5 10*3/uL (ref 0.7–3.1)
Lymphs: 45 %
MCH: 30.5 pg (ref 26.6–33.0)
MCHC: 34.4 g/dL (ref 31.5–35.7)
MCV: 89 fL (ref 79–97)
Monocytes Absolute: 0.3 10*3/uL (ref 0.1–0.9)
Monocytes: 8 %
Neutrophils Absolute: 1.4 10*3/uL (ref 1.4–7.0)
Neutrophils: 41 %
Platelets: 185 10*3/uL (ref 150–450)
RBC: 4.72 x10E6/uL (ref 3.77–5.28)
RDW: 12.3 % (ref 11.7–15.4)
WBC: 3.3 10*3/uL — ABNORMAL LOW (ref 3.4–10.8)

## 2018-11-12 LAB — COMPREHENSIVE METABOLIC PANEL
ALT: 71 IU/L — ABNORMAL HIGH (ref 0–32)
AST: 51 IU/L — ABNORMAL HIGH (ref 0–40)
Albumin/Globulin Ratio: 2 (ref 1.2–2.2)
Albumin: 4.4 g/dL (ref 3.8–4.8)
Alkaline Phosphatase: 102 IU/L (ref 39–117)
BUN/Creatinine Ratio: 21 (ref 12–28)
BUN: 15 mg/dL (ref 8–27)
Bilirubin Total: 0.4 mg/dL (ref 0.0–1.2)
CO2: 22 mmol/L (ref 20–29)
Calcium: 8.9 mg/dL (ref 8.7–10.3)
Chloride: 103 mmol/L (ref 96–106)
Creatinine, Ser: 0.72 mg/dL (ref 0.57–1.00)
GFR calc Af Amer: 102 mL/min/{1.73_m2} (ref >59)
GFR calc non Af Amer: 89 mL/min/{1.73_m2} (ref >59)
Globulin, Total: 2.2 g/dL (ref 1.5–4.5)
Glucose: 106 mg/dL — ABNORMAL HIGH (ref 65–99)
Potassium: 4.5 mmol/L (ref 3.5–5.2)
Sodium: 139 mmol/L (ref 134–144)
Total Protein: 6.6 g/dL (ref 6.0–8.5)

## 2018-11-12 LAB — LIPID PANEL
Chol/HDL Ratio: 5.5 ratio — ABNORMAL HIGH (ref 0.0–4.4)
Cholesterol, Total: 210 mg/dL — ABNORMAL HIGH (ref 100–199)
HDL: 38 mg/dL — ABNORMAL LOW (ref >39)
LDL Calculated: 131 mg/dL — ABNORMAL HIGH (ref 0–99)
Triglycerides: 206 mg/dL — ABNORMAL HIGH (ref 0–149)
VLDL Cholesterol Cal: 41 mg/dL — ABNORMAL HIGH (ref 5–40)

## 2018-11-12 LAB — HIGH SENSITIVITY CRP: CRP, High Sensitivity: 5.35 mg/L — ABNORMAL HIGH (ref 0.00–3.00)

## 2018-11-12 LAB — HEMOGLOBIN A1C
Est. average glucose Bld gHb Est-mCnc: 100 mg/dL
Hgb A1c MFr Bld: 5.1 % (ref 4.8–5.6)

## 2018-12-13 ENCOUNTER — Encounter: Payer: BLUE CROSS/BLUE SHIELD | Admitting: Family Medicine

## 2018-12-14 ENCOUNTER — Encounter: Payer: BLUE CROSS/BLUE SHIELD | Admitting: Family Medicine

## 2019-02-22 ENCOUNTER — Encounter: Payer: BLUE CROSS/BLUE SHIELD | Admitting: Family Medicine

## 2019-03-17 ENCOUNTER — Telehealth: Payer: Self-pay

## 2019-03-17 NOTE — Telephone Encounter (Signed)
Questions for Screening COVID-19  Symptom onset: None  Travel or Contacts: None  During this illness, did/does the patient experience any of the following symptoms? Fever >100.60F []   Yes [x]   No []   Unknown Subjective fever (felt feverish) []   Yes [x]   No []   Unknown Chills []   Yes [x]   No []   Unknown Muscle aches (myalgia) []   Yes [x]   No []   Unknown Runny nose (rhinorrhea) []   Yes [x]   No []   Unknown Sore throat []   Yes [x]   No []   Unknown Cough (new onset or worsening of chronic cough) []   Yes [x]   No []   Unknown Shortness of breath (dyspnea) []   Yes [x]   No []   Unknown Nausea or vomiting []   Yes [x]   No []   Unknown Headache []   Yes [x]   No []   Unknown Abdominal pain  []   Yes [x]   No []   Unknown Diarrhea (?3 loose/looser than normal stools/24hr period) []   Yes [x]   No []   Unknown Other, specify:  Patient risk factors: Smoker? []   Current []   Former []   Never If female, currently pregnant? []   Yes []   No  Patient Active Problem List   Diagnosis Date Noted  . Hypertension 04/29/2017  . Pure hypercholesterolemia 04/29/2017  . Vitamin Brien Lowe deficiency 04/29/2017  . Anxiety state 04/29/2017  . Chronic constipation 04/29/2017  . Fatty liver 04/29/2017    Plan:  []   High risk for COVID-19 with red flags go to ED (with CP, SOB, weak/lightheaded, or fever > 101.5). Call ahead.  []   High risk for COVID-19 but stable. Inform provider and coordinate time for [facility] visit.   []   No red flags but URI signs or symptoms okay for [facility] visit.

## 2019-03-18 NOTE — Progress Notes (Signed)
Subjective:   Patient ID: Angela Graves, female    DOB: [birth year], 63 y.o.   MRN: [id number]  Angela Graves is a pleasant 63 y.o. year old female who presents to clinic today with [place] (TOC/ no complaints/ med refill on all prescriptions)  on 03/21/2019  HPI:  Here to establish care.  Notes reviewed in Epic.  Last saw her previous PCP, [name], MD on 11/11/18 and last her CPX on 05/04/19.  Notes reviewed.   Migraines- gets them twice a year.  Takes alleve for abortive therapy.  Associated with nausea, vomiting and photophobia. Takes phenergan for nausea.  HTN- taking amlodipine 10 mg daily and tolerating this well.  Denies edema.  Lab Results  Component Value Date   CREATININE 0.72 11/11/2018    Chronic constipation: controlled by daily miralax.  Elevated LFTs- Lab Results  Component Value Date   ALT 71 (H) 11/11/2018   AST 51 (H) 11/11/2018   ALKPHOS 102 11/11/2018   BILITOT 0.4 11/11/2018   [place] from 07/2015 did confirm fatty liver.   Anxiety- it appears she has been on valium for years.   Attempted to wean off it but has been unable to but has weaned it down to 2.5 mg every morning.  I reviewed PDMP and she is picking up the rx montthly.  Dyspareunia- was prescribed prasterone for this nightly at bedtime for dyspareunia and vaginal atrophy. Remote h/o TAHBSO.  HLD-  Lab Results  Component Value Date   CHOL 210 (H) 11/11/2018   HDL 38 (L) 11/11/2018   LDLCALC 131 (H) 11/11/2018   TRIG 206 (H) 11/11/2018   CHOLHDL 5.5 (H) 11/11/2018   Current Outpatient Medications on File Prior to Visit  Medication Sig Dispense Refill  . cholecalciferol (VITAMIN D) 1000 units tablet Take 1,000 Units by mouth daily.     No current facility-administered medications on file prior to visit.     Allergies  Allergen Reactions  . Codeine Itching  . Morphine And Related Other (See Comments)    Hyperactive    Past Medical History:  Diagnosis Date  . Anxiety    . Migraine     Past Surgical History:  Procedure Laterality Date  . ABDOMINAL HYSTERECTOMY Bilateral 1979   complete with BSO due to endometriosis and ovarian cysts  . APPENDECTOMY    . CHOLECYSTECTOMY    . JOINT REPLACEMENT    . KNEE SURGERY      Family History  Adopted: Yes  Family history unknown: Yes    Social History   Socioeconomic History  . Marital status: Married    Spouse name: Not on file  . Number of children: 0  . Years of education: Not on file  . Highest education level: High school graduate  Occupational History  . Not on file  Social Needs  . Financial resource strain: Not on file  . Food insecurity    Worry: Not on file    Inability: Not on file  . Transportation needs    Medical: Not on file    Non-medical: Not on file  Tobacco Use  . Smoking status: Former Research scientist (life sciences)  . Smokeless tobacco: Former Network engineer and Sexual Activity  . Alcohol use: Yes    Alcohol/week: 7.0 standard drinks    Types: 5 Glasses of wine, 2 Standard drinks or equivalent per week    Comment: socially  . Drug use: No  . Sexual activity: Not Currently    Birth control/protection:  Post-menopausal, Surgical    Comment: s/p complete hysterectomy with BSO in her 2s  Lifestyle  . Physical activity    Days per week: Not on file    Minutes per session: Not on file  . Stress: Not on file  Relationships  . Social Herbalist on phone: Not on file    Gets together: Not on file    Attends religious service: Not on file    Active member of club or organization: Not on file    Attends meetings of clubs or organizations: Not on file    Relationship status: Not on file  . Intimate partner violence    Fear of current or ex partner: Not on file    Emotionally abused: Not on file    Physically abused: Not on file    Forced sexual activity: Not on file  Other Topics Concern  . Not on file  Social History Narrative  . Not on file   The PMH, PSH, Social History,  Family History, Medications, and allergies have been reviewed in [facility], and have been updated if relevant.   Review of Systems  Constitutional: Negative.   HENT: Negative.   Eyes: Negative.   Respiratory: Negative.   Cardiovascular: Negative.   Gastrointestinal: Negative.   Endocrine: Negative.   Genitourinary: Negative.   Musculoskeletal: Negative.   Allergic/Immunologic: Negative.   Neurological: Negative.   Hematological: Negative.   Psychiatric/Behavioral: Negative.   All other systems reviewed and are negative.      Objective:    BP 130/80   Pulse 70   Temp 97.6 F (36.4 C) (Oral)   Ht 5\' 3"  (1.6 m)   Wt 157 lb 9.6 oz (71.5 kg)   SpO2 99%   BMI 27.92 kg/m    Physical Exam    General:  Well-developed,well-nourished,in no acute distress; alert,appropriate and cooperative throughout examination Head:  normocephalic and atraumatic.   Eyes:  vision grossly intact, PERRL Ears:  R ear normal and L ear normal externally, TMs clear bilaterally Nose:  no external deformity.   Mouth:  good dentition.   Neck:  No deformities, masses, or tenderness noted. Lungs:  Normal respiratory effort, chest expands symmetrically. Lungs are clear to auscultation, no crackles or wheezes. Heart:  Normal rate and regular rhythm. S1 and S2 normal without gallop, murmur, click, rub or other extra sounds. Abdomen:  Bowel sounds positive,abdomen soft and non-tender without masses, organomegaly or hernias noted. Msk:  No deformity or scoliosis noted of thoracic or lumbar spine.   Extremities:  No clubbing, cyanosis, edema, or deformity noted with normal full range of motion of all joints.   Neurologic:  alert & oriented X3 and gait normal.   Skin:  Intact without suspicious lesions or rashes Cervical Nodes:  No lymphadenopathy noted Axillary Nodes:  No palpable lymphadenopathy Psych:  Cognition and judgment appear intact. Alert and cooperative with normal attention span and concentration. No  apparent delusions, illusions, hallucinations      Assessment & Plan:   Essential hypertension -  Pure hypercholesterolemia -  Vitamin D deficiency   Anxiety state -   Chronic constipation -  Fatty liver -   Chronic prescription benzodiazepine use - Plan: Pain Mgmt, Profile 8 w/Conf, U,  Elevated LFTs -   Dyspareunia in female -  No follow-ups on file.

## 2019-03-19 DIAGNOSIS — R7989 Other specified abnormal findings of blood chemistry: Secondary | ICD-10-CM | POA: Insufficient documentation

## 2019-03-19 DIAGNOSIS — Z79899 Other long term (current) drug therapy: Secondary | ICD-10-CM | POA: Insufficient documentation

## 2019-03-19 DIAGNOSIS — N941 Unspecified dyspareunia: Secondary | ICD-10-CM | POA: Insufficient documentation

## 2019-03-21 ENCOUNTER — Encounter: Payer: Self-pay | Admitting: Family Medicine

## 2019-03-21 ENCOUNTER — Ambulatory Visit (INDEPENDENT_AMBULATORY_CARE_PROVIDER_SITE_OTHER): Payer: BC Managed Care – PPO | Admitting: Family Medicine

## 2019-03-21 VITALS — BP 130/80 | HR 70 | Temp 97.6°F | Ht 63.0 in | Wt 157.6 lb

## 2019-03-21 DIAGNOSIS — R110 Nausea: Secondary | ICD-10-CM

## 2019-03-21 DIAGNOSIS — I10 Essential (primary) hypertension: Secondary | ICD-10-CM

## 2019-03-21 DIAGNOSIS — R1013 Epigastric pain: Secondary | ICD-10-CM

## 2019-03-21 DIAGNOSIS — N941 Unspecified dyspareunia: Secondary | ICD-10-CM

## 2019-03-21 DIAGNOSIS — Z79899 Other long term (current) drug therapy: Secondary | ICD-10-CM

## 2019-03-21 DIAGNOSIS — E559 Vitamin D deficiency, unspecified: Secondary | ICD-10-CM

## 2019-03-21 DIAGNOSIS — F411 Generalized anxiety disorder: Secondary | ICD-10-CM

## 2019-03-21 DIAGNOSIS — R7989 Other specified abnormal findings of blood chemistry: Secondary | ICD-10-CM

## 2019-03-21 DIAGNOSIS — E7800 Pure hypercholesterolemia, unspecified: Secondary | ICD-10-CM

## 2019-03-21 DIAGNOSIS — Z79890 Hormone replacement therapy (postmenopausal): Secondary | ICD-10-CM | POA: Insufficient documentation

## 2019-03-21 DIAGNOSIS — K760 Fatty (change of) liver, not elsewhere classified: Secondary | ICD-10-CM

## 2019-03-21 DIAGNOSIS — R945 Abnormal results of liver function studies: Secondary | ICD-10-CM

## 2019-03-21 DIAGNOSIS — K5909 Other constipation: Secondary | ICD-10-CM

## 2019-03-21 DIAGNOSIS — G43909 Migraine, unspecified, not intractable, without status migrainosus: Secondary | ICD-10-CM | POA: Insufficient documentation

## 2019-03-21 DIAGNOSIS — G43709 Chronic migraine without aura, not intractable, without status migrainosus: Secondary | ICD-10-CM

## 2019-03-21 MED ORDER — DIAZEPAM 5 MG PO TABS: ORAL_TABLET | ORAL | 5 refills | Status: DC

## 2019-03-21 MED ORDER — AMLODIPINE BESYLATE 10 MG PO TABS: ORAL_TABLET | ORAL | 3 refills | Status: DC

## 2019-03-21 MED ORDER — PROMETHAZINE HCL 25 MG PO TABS: 25.0000 mg | ORAL_TABLET | Freq: Three times a day (TID) | ORAL | 1 refills | Status: DC | PRN

## 2019-03-21 MED ORDER — INTRAROSA 6.5 MG VA INST: 6.5000 mg | VAGINAL_INSERT | Freq: Every day | VAGINAL | 5 refills | Status: DC

## 2019-03-21 NOTE — Patient Instructions (Addendum)
It was so great to meet you!  Please schedule a physical with me in 10/2019.

## 2019-03-21 NOTE — Assessment & Plan Note (Signed)
Well controlled on norvasc and labs up today.  Advised scheduling CPX in 10/2019 when she is due and we will repeat labs at that time. The patient indicates understanding of these issues and agrees with the plan.

## 2019-03-21 NOTE — Assessment & Plan Note (Signed)
Likely due to fatty liver.  Will continue to check. She is aware to avoid excessive tylenol, ETOH, etc.

## 2019-03-21 NOTE — Assessment & Plan Note (Addendum)
She has been using prasterone for this.  I did explain to her that I have never prescribed this medication.  While it is not classic "HRT" it is actually an inactive steroid that is converted into active androgens and/or estrogens; the mechanism of action in postmenopausal women with vulvar and vaginal atrophy is unknown.  She is aware and would like to continue this.

## 2019-03-21 NOTE — Assessment & Plan Note (Signed)
Working on diet.  Will recheck again in 10/2019.  The 10-year ASCVD risk score Mikey Bussing DC Brooke Bonito., et al., 2013) is: 8.4%   Values used to calculate the score:     Age: 63 years     Sex: Female     Is Non-Hispanic African American: No     Diabetic: No     Tobacco smoker: No     Systolic Blood Pressure: 606 mmHg     Is BP treated: Yes     HDL Cholesterol: 38 mg/dL     Total Cholesterol: 210 mg/dL

## 2019-03-21 NOTE — Assessment & Plan Note (Signed)
Gets them very rarely but is asking for refill of phenergan as they are associated with nausea and vomiting.  This is an appropriate request- eRx refill sent.

## 2019-03-21 NOTE — Assessment & Plan Note (Signed)
Controlled with daily miralax.  No changes made today.

## 2019-03-21 NOTE — Assessment & Plan Note (Signed)
PDMP reviewed today. She has weaned her valium down quite a bit.  I told her that I would refill this but we needed to get a CSC/UDS.  She is in a agreement. Orders Placed This Encounter  Procedures  . Pain Mgmt, Profile 8 w/Conf, U

## 2019-03-23 LAB — PAIN MGMT, PROFILE 8 W/CONF, U
6 Acetylmorphine: NEGATIVE ng/mL
Alcohol Metabolites: POSITIVE ng/mL — AB (ref <500)
Alphahydroxyalprazolam: NEGATIVE ng/mL
Alphahydroxymidazolam: NEGATIVE ng/mL
Alphahydroxytriazolam: NEGATIVE ng/mL
Aminoclonazepam: NEGATIVE ng/mL
Amphetamines: NEGATIVE ng/mL
Benzodiazepines: POSITIVE ng/mL
Buprenorphine, Urine: NEGATIVE ng/mL
Cocaine Metabolite: NEGATIVE ng/mL
Creatinine: 25.7 mg/dL
Ethyl Glucuronide (ETG): 856 ng/mL
Ethyl Sulfate (ETS): 174 ng/mL
Hydroxyethylflurazepam: NEGATIVE ng/mL
Lorazepam: NEGATIVE ng/mL
MDMA: NEGATIVE ng/mL
Marijuana Metabolite: NEGATIVE ng/mL
Nordiazepam: 90 ng/mL
Opiates: NEGATIVE ng/mL
Oxazepam: 96 ng/mL
Oxidant: NEGATIVE ug/mL
Oxycodone: NEGATIVE ng/mL
Temazepam: 75 ng/mL
pH: 5.3 (ref 4.5–9.0)

## 2019-03-24 ENCOUNTER — Telehealth: Payer: Self-pay | Admitting: Family Medicine

## 2019-03-24 NOTE — Telephone Encounter (Signed)
Prasterone (INTRAROSA) 6.5 MG INST [[id number]]    Pharmacy called and stated that PA is needed.

## 2019-03-25 NOTE — Telephone Encounter (Signed)
PA started via covermymeds. Key: [id number]

## 2019-03-28 NOTE — Telephone Encounter (Signed)
PA denied. Insurance should be faxing a copy of the denial to the office.

## 2019-06-21 ENCOUNTER — Ambulatory Visit: Payer: BC Managed Care – PPO

## 2019-06-29 ENCOUNTER — Other Ambulatory Visit: Payer: Self-pay

## 2019-06-29 ENCOUNTER — Ambulatory Visit (INDEPENDENT_AMBULATORY_CARE_PROVIDER_SITE_OTHER): Payer: PPO | Admitting: Behavioral Health

## 2019-06-29 DIAGNOSIS — Z23 Encounter for immunization: Secondary | ICD-10-CM

## 2019-06-29 NOTE — Progress Notes (Signed)
Patient presents in clinic today for Influenza vaccination. IM injection was given in the left deltoid. Patient tolerated the injection well. No signs or symptoms of a reaction were noted prior to patient leaving the nurse visit. 

## 2019-08-01 ENCOUNTER — Other Ambulatory Visit: Payer: Self-pay

## 2019-08-01 ENCOUNTER — Telehealth: Payer: Self-pay | Admitting: Family Medicine

## 2019-08-01 MED ORDER — INTRAROSA 6.5 MG VA INST: 6.5000 mg | VAGINAL_INSERT | Freq: Every day | VAGINAL | 5 refills | Status: DC

## 2019-08-01 NOTE — Telephone Encounter (Signed)
Last OV 03/21/19 Last fill 03/21/19  #30/5

## 2019-08-01 NOTE — Telephone Encounter (Signed)
Prasterone (INTRAROSA) 6.5 MG INST   Patient requesting refill. Advised by pharmacy to contact PCP as this requires auth.  [id number] [address], [address] AT [facility] OF [address] [phone] (Phone) [phone]

## 2019-08-05 NOTE — Telephone Encounter (Signed)
Patient husband called to say that medication need prior authorization per pharmacy. Asking for this to be done so that the patient can get her medication please ASAP. Ph#  [phone]

## 2019-08-16 ENCOUNTER — Telehealth: Payer: Self-pay

## 2019-08-16 NOTE — Telephone Encounter (Signed)
Medication Refill - Medication: Prasterone (INTRAROSA) 6.5 MG INST [id number]     Preferred Pharmacy (with phone number or street name):  [facility] [id number] [address], [address] AT [facility] OF [place]  [place] [address]  Phone: [phone] Fax: [phone]     Agent: Please be advised that RX refills may take up to 3 business days. We ask that you follow-up with your pharmacy.         Patient husband called in wife is needing medications as soon as possible for insurance purposes . Please advise    Call back number [phone]

## 2019-08-16 NOTE — Telephone Encounter (Signed)
I spoke with pt and informed her that a new PA was started today, as she has new insurance.

## 2019-08-16 NOTE — Telephone Encounter (Signed)
Copied from [place] [id number]. Topic: General - Inquiry >> Aug 16, 2019 11:10 AM Alease Frame wrote: Reason for CRM: patients husband is needing a call back from office once medication has been approved and sent over to Pharmacy

## 2019-08-16 NOTE — Telephone Encounter (Signed)
Started  PA for Intrarosa 6.5MG  today. Key# [id number]  [id number] PA case ID# [id number]  Pt informed that PA has been started today, with new insurance information.

## 2019-08-24 ENCOUNTER — Telehealth: Payer: Self-pay

## 2019-08-24 NOTE — Telephone Encounter (Signed)
Merilyn Baba Key: [id number] - PA Case ID: [id number] Need help? Call us at [phone] Outcome Approvedon December 1 PA Case: [id number], Status: Approved, Coverage Starts on: 08/16/2019 12:00:00 AM, Coverage Ends on: 08/15/2020 12:00:00 AM. Drug Intrarosa 6.5MG  inserts Form [employer]

## 2019-10-03 ENCOUNTER — Other Ambulatory Visit: Payer: Self-pay

## 2019-10-03 MED ORDER — DIAZEPAM 5 MG PO TABS: ORAL_TABLET | ORAL | 5 refills | Status: DC

## 2019-10-03 NOTE — Telephone Encounter (Signed)
Last OV 03/21/19 Last fill 03/21/19  #60/5

## 2019-10-06 ENCOUNTER — Ambulatory Visit: Payer: PPO | Attending: Internal Medicine

## 2019-10-06 DIAGNOSIS — Z23 Encounter for immunization: Secondary | ICD-10-CM

## 2019-10-06 NOTE — Progress Notes (Signed)
   Covid-19 Vaccination Clinic  Name:  Angela Graves    MRN: [id number] DOB: Jul 29, 1956  10/06/2019  [name] was observed post Covid-19 immunization for 15 minutes without incidence. She was provided with Vaccine Information Sheet and instruction to access the V-Safe system.   [name] was instructed to call 911 with any severe reactions post vaccine: Marland Kitchen Difficulty breathing  . Swelling of your face and throat  . A fast heartbeat  . A bad rash all over your body  . Dizziness and weakness    Immunizations Administered    Name Date Dose VIS Date Route   Pfizer COVID-19 Vaccine 10/06/2019  1:26 PM 0.3 mL 08/26/2019 Intramuscular   Manufacturer: [place]   Lot: [id number]   [place]: [id number]

## 2019-10-27 ENCOUNTER — Ambulatory Visit: Payer: PPO | Attending: Internal Medicine

## 2019-10-27 DIAGNOSIS — Z23 Encounter for immunization: Secondary | ICD-10-CM

## 2019-10-27 NOTE — Progress Notes (Signed)
   Covid-19 Vaccination Clinic  Name:  Angela Graves    MRN: [id number] DOB: 1956/01/04  10/27/2019  [name] was observed post Covid-19 immunization for 15 minutes without incidence. She was provided with Vaccine Information Sheet and instruction to access the V-Safe system.   [name] was instructed to call 911 with any severe reactions post vaccine: Marland Kitchen Difficulty breathing  . Swelling of your face and throat  . A fast heartbeat  . A bad rash all over your body  . Dizziness and weakness    Immunizations Administered    Name Date Dose VIS Date Route   Pfizer COVID-19 Vaccine 10/27/2019  2:18 PM 0.3 mL 08/26/2019 Intramuscular   Manufacturer: [place]   Lot: [id number]   [place]: [id number]

## 2019-12-29 ENCOUNTER — Encounter: Payer: Self-pay | Admitting: Family Medicine

## 2019-12-29 ENCOUNTER — Telehealth (INDEPENDENT_AMBULATORY_CARE_PROVIDER_SITE_OTHER): Payer: PPO | Admitting: Family Medicine

## 2019-12-29 VITALS — Temp 98.0°F | Ht 63.0 in

## 2019-12-29 DIAGNOSIS — N941 Unspecified dyspareunia: Secondary | ICD-10-CM

## 2019-12-29 DIAGNOSIS — I10 Essential (primary) hypertension: Secondary | ICD-10-CM

## 2019-12-29 DIAGNOSIS — J302 Other seasonal allergic rhinitis: Secondary | ICD-10-CM

## 2019-12-29 MED ORDER — INTRAROSA 6.5 MG VA INST: 6.5000 mg | VAGINAL_INSERT | Freq: Every day | VAGINAL | 11 refills | Status: DC

## 2019-12-29 MED ORDER — AMLODIPINE BESYLATE 10 MG PO TABS: ORAL_TABLET | ORAL | 3 refills | Status: DC

## 2019-12-29 NOTE — Patient Instructions (Signed)
Health Maintenance Due  Topic Date Due  . Fecal DNA (Cologuard)  05/08/2019  . DEXA SCAN  Never done  . PNA vac Low Risk Adult (1 of 2 - PCV13) Never done    Depression screen [facility] 2/9 11/11/2018 05/03/2018 11/04/2017  Decreased Interest 0 0 0  Down, Depressed, Hopeless 0 0 0  PHQ - 2 Score 0 0 0

## 2019-12-29 NOTE — Progress Notes (Signed)
Virtual Visit via Video Note  I connected with Angela Graves on 12/29/19 at  8:30 AM EDT by a video enabled telemedicine application and verified that I am speaking with the correct person using two identifiers. Location patient: home Location provider: work  Persons participating in the virtual visit: patient, provider  I discussed the limitations of evaluation and management by telemedicine and the availability of in person appointments. The patient expressed understanding and agreed to proceed.  Chief Complaint  Patient presents with  . Establish Care    medication refill. Pt would to discuss OTC allergy medications.     HPI: Angela Graves is a 63 y.o. female seen for [facility] appt, previous PCP Dr. Deborra Medina.  Pt requests refills of her BP med - norvasc 10mg  daily. She also needs a refill of intrarosa, she is s/p TAH in 1979.  Pt takes benadryl qHS for allergies, also zyrtec in AM. She does not like or use nasal sprays and will not consider this as a treatment option.   Past Medical History:  Diagnosis Date  . Anxiety   . Migraine     Past Surgical History:  Procedure Laterality Date  . ABDOMINAL HYSTERECTOMY Bilateral 1979   complete with BSO due to endometriosis and ovarian cysts  . APPENDECTOMY    . CHOLECYSTECTOMY    . JOINT REPLACEMENT    . KNEE SURGERY      Family History  Adopted: Yes  Family history unknown: Yes    Social History   Tobacco Use  . Smoking status: Former Research scientist (life sciences)  . Smokeless tobacco: Former Network engineer Use Topics  . Alcohol use: Yes    Alcohol/week: 7.0 standard drinks    Types: 5 Glasses of wine, 2 Standard drinks or equivalent per week    Comment: socially  . Drug use: No     Current Outpatient Medications:  .  amLODipine (NORVASC) 10 MG tablet, TAKE 1 TABLET(10 MG) BY MOUTH DAILY, Disp: 90 tablet, Rfl: 3 .  cholecalciferol (VITAMIN D) 1000 units tablet, Take 1,000 Units by mouth daily., Disp: , Rfl:  .  diazepam (VALIUM) 5 MG  tablet, TAKE 1 TABLET(5 MG) BY MOUTH EVERY 12 HOURS AS NEEDED FOR ANXIETY, Disp: 60 tablet, Rfl: 5 .  Prasterone (INTRAROSA) 6.5 MG INST, Place 6.5 mg vaginally at bedtime., Disp: 30 each, Rfl: 11 .  promethazine (PHENERGAN) 25 MG tablet, Take 1 tablet (25 mg total) by mouth every 8 (eight) hours as needed for nausea (or migraine)., Disp: 30 tablet, Rfl: 1  Allergies  Allergen Reactions  . Codeine Itching  . Morphine And Related Other (See Comments)    Hyperactive      ROS: See pertinent positives and negatives per HPI.   EXAM:  VITALS per patient if applicable: Temp 98 F (123XX123 C) (Oral)   Ht 5\' 3"  (1.6 m)   BMI 27.92 kg/m    GENERAL: alert, oriented, appears well and in no acute distress  HEENT: atraumatic, conjunctiva clear, no obvious abnormalities on inspection of external nose and ears  NECK: normal movements of the head and neck  LUNGS: on inspection no signs of respiratory distress, breathing rate appears normal, no obvious gross SOB, gasping or wheezing, no conversational dyspnea  CV: no obvious cyanosis  PSYCH/NEURO: pleasant and cooperative, no obvious depression or anxiety, speech and thought processing grossly intact   ASSESSMENT AND PLAN:  1. Dyspareunia in female - stable Refill: - Prasterone (INTRAROSA) 6.5 MG INST; Place 6.5 mg vaginally  at bedtime.  Dispense: 30 each; Refill: 11  2. Essential hypertension - pt does not have BP cuff so unable to provide BP reading today - she is due for CPE and I advised her to schedule in coming months - will check BP at that time Refill: - amLODipine (NORVASC) 10 MG tablet; TAKE 1 TABLET(10 MG) BY MOUTH DAILY  Dispense: 90 tablet; Refill: 3 - low sodium diet and regular CV exercise  3. Seasonal allergies - pt refuses to use nasal saline spray or nasal steroid spray - advised taking zyrtec in evening rather than AM since pt feels symptoms are worse at night and med doesn't last to control symptoms when she takes  in AM - recommended trial of cool mist humidifier - f/u PRN    I discussed the assessment and treatment plan with the patient. The patient was provided an opportunity to ask questions and all were answered. The patient agreed with the plan and demonstrated an understanding of the instructions.   The patient was advised to call back or seek an in-person evaluation if the symptoms worsen or if the condition fails to improve as anticipated.   Letta Median, DO

## 2020-04-10 ENCOUNTER — Other Ambulatory Visit: Payer: Self-pay

## 2020-04-18 DIAGNOSIS — Z0001 Encounter for general adult medical examination with abnormal findings: Secondary | ICD-10-CM | POA: Diagnosis not present

## 2020-04-18 DIAGNOSIS — R200 Anesthesia of skin: Secondary | ICD-10-CM | POA: Diagnosis not present

## 2020-04-18 DIAGNOSIS — Z1331 Encounter for screening for depression: Secondary | ICD-10-CM | POA: Diagnosis not present

## 2020-04-18 DIAGNOSIS — I10 Essential (primary) hypertension: Secondary | ICD-10-CM | POA: Diagnosis not present

## 2020-04-18 DIAGNOSIS — M6281 Muscle weakness (generalized): Secondary | ICD-10-CM | POA: Diagnosis not present

## 2020-04-18 DIAGNOSIS — M545 Low back pain: Secondary | ICD-10-CM | POA: Diagnosis not present

## 2020-04-23 DIAGNOSIS — R7301 Impaired fasting glucose: Secondary | ICD-10-CM | POA: Diagnosis not present

## 2020-04-23 DIAGNOSIS — E559 Vitamin D deficiency, unspecified: Secondary | ICD-10-CM | POA: Diagnosis not present

## 2020-04-23 DIAGNOSIS — E782 Mixed hyperlipidemia: Secondary | ICD-10-CM | POA: Diagnosis not present

## 2020-04-23 DIAGNOSIS — D72819 Decreased white blood cell count, unspecified: Secondary | ICD-10-CM | POA: Diagnosis not present

## 2020-04-23 DIAGNOSIS — Z79899 Other long term (current) drug therapy: Secondary | ICD-10-CM | POA: Diagnosis not present

## 2020-04-23 DIAGNOSIS — I10 Essential (primary) hypertension: Secondary | ICD-10-CM | POA: Diagnosis not present

## 2020-05-29 ENCOUNTER — Telehealth: Payer: Self-pay | Admitting: Family Medicine

## 2020-05-29 NOTE — Telephone Encounter (Signed)
Left message for patient to schedule Annual Wellness Visit.  Please schedule with Nurse Health Advisor Martha Stanley, RN at [place]  °

## 2020-06-14 DIAGNOSIS — Z20822 Contact with and (suspected) exposure to covid-19: Secondary | ICD-10-CM | POA: Diagnosis not present

## 2020-06-14 DIAGNOSIS — Z20828 Contact with and (suspected) exposure to other viral communicable diseases: Secondary | ICD-10-CM | POA: Diagnosis not present

## 2020-08-27 DIAGNOSIS — Z20822 Contact with and (suspected) exposure to covid-19: Secondary | ICD-10-CM | POA: Diagnosis not present

## 2020-10-08 DIAGNOSIS — Z20822 Contact with and (suspected) exposure to covid-19: Secondary | ICD-10-CM | POA: Diagnosis not present

## 2020-11-29 DIAGNOSIS — Z20822 Contact with and (suspected) exposure to covid-19: Secondary | ICD-10-CM | POA: Diagnosis not present

## 2020-12-13 DIAGNOSIS — Z20822 Contact with and (suspected) exposure to covid-19: Secondary | ICD-10-CM | POA: Diagnosis not present

## 2020-12-13 DIAGNOSIS — Z03818 Encounter for observation for suspected exposure to other biological agents ruled out: Secondary | ICD-10-CM | POA: Diagnosis not present

## 2021-01-07 DIAGNOSIS — H5203 Hypermetropia, bilateral: Secondary | ICD-10-CM | POA: Diagnosis not present

## 2021-01-07 DIAGNOSIS — H2513 Age-related nuclear cataract, bilateral: Secondary | ICD-10-CM | POA: Diagnosis not present

## 2021-01-07 DIAGNOSIS — H524 Presbyopia: Secondary | ICD-10-CM | POA: Diagnosis not present

## 2021-02-12 DIAGNOSIS — Z20822 Contact with and (suspected) exposure to covid-19: Secondary | ICD-10-CM | POA: Diagnosis not present

## 2021-03-03 DIAGNOSIS — Z20822 Contact with and (suspected) exposure to covid-19: Secondary | ICD-10-CM | POA: Diagnosis not present

## 2021-03-03 DIAGNOSIS — Z03818 Encounter for observation for suspected exposure to other biological agents ruled out: Secondary | ICD-10-CM | POA: Diagnosis not present

## 2021-05-03 ENCOUNTER — Ambulatory Visit: Payer: PPO | Admitting: Internal Medicine

## 2021-05-07 ENCOUNTER — Encounter: Payer: Self-pay | Admitting: Internal Medicine

## 2021-05-07 ENCOUNTER — Other Ambulatory Visit: Payer: Self-pay

## 2021-05-07 ENCOUNTER — Ambulatory Visit (INDEPENDENT_AMBULATORY_CARE_PROVIDER_SITE_OTHER): Payer: PPO | Admitting: Internal Medicine

## 2021-05-07 VITALS — BP 116/74 | HR 76 | Temp 98.4°F | Resp 18 | Ht 63.0 in | Wt 165.4 lb

## 2021-05-07 DIAGNOSIS — R7989 Other specified abnormal findings of blood chemistry: Secondary | ICD-10-CM

## 2021-05-07 DIAGNOSIS — E7800 Pure hypercholesterolemia, unspecified: Secondary | ICD-10-CM | POA: Diagnosis not present

## 2021-05-07 DIAGNOSIS — Z1231 Encounter for screening mammogram for malignant neoplasm of breast: Secondary | ICD-10-CM

## 2021-05-07 DIAGNOSIS — F411 Generalized anxiety disorder: Secondary | ICD-10-CM | POA: Diagnosis not present

## 2021-05-07 DIAGNOSIS — Z1211 Encounter for screening for malignant neoplasm of colon: Secondary | ICD-10-CM

## 2021-05-07 DIAGNOSIS — I10 Essential (primary) hypertension: Secondary | ICD-10-CM

## 2021-05-07 DIAGNOSIS — E2839 Other primary ovarian failure: Secondary | ICD-10-CM

## 2021-05-07 MED ORDER — AMLODIPINE BESYLATE 10 MG PO TABS: ORAL_TABLET | ORAL | 3 refills | Status: DC

## 2021-05-07 MED ORDER — DIAZEPAM 5 MG PO TABS: ORAL_TABLET | ORAL | 5 refills | Status: DC

## 2021-05-07 NOTE — Patient Instructions (Addendum)
Think about getting the pneumonia shot and will check the labs today.  Depending on the liver results we may have you see a GI doctor   We will send the cologuard to the house and get the mammogram done.

## 2021-05-07 NOTE — Progress Notes (Signed)
   Subjective:   Patient ID: Angela Graves, female    DOB: May 22, 1956, 65 y.o.   MRN: [id number]  HPI The patient is a new 65 YO coming in for transfer of care. Elevated liver numbers not really evaluated in the past.   Review of Systems  Constitutional: Negative.   HENT: Negative.    Eyes: Negative.   Respiratory:  Negative for cough, chest tightness and shortness of breath.   Cardiovascular:  Negative for chest pain, palpitations and leg swelling.  Gastrointestinal:  Negative for abdominal distention, abdominal pain, constipation, diarrhea, nausea and vomiting.  Musculoskeletal: Negative.   Skin: Negative.   Neurological: Negative.   Psychiatric/Behavioral: Negative.     Objective:  Physical Exam Constitutional:      Appearance: She is well-developed.  HENT:     Head: Normocephalic and atraumatic.  Cardiovascular:     Rate and Rhythm: Normal rate and regular rhythm.  Pulmonary:     Effort: Pulmonary effort is normal. No respiratory distress.     Breath sounds: Normal breath sounds. No wheezing or rales.  Abdominal:     General: Bowel sounds are normal. There is no distension.     Palpations: Abdomen is soft.     Tenderness: There is no abdominal tenderness. There is no rebound.  Musculoskeletal:     Cervical back: Normal range of motion.  Skin:    General: Skin is warm and dry.  Neurological:     Mental Status: She is alert and oriented to person, place, and time.     Coordination: Coordination normal.    Vitals:   05/07/21 1529  BP: 116/74  Pulse: 76  Resp: 18  Temp: 98.4 F (36.9 C)  TempSrc: Oral  SpO2: 98%  Weight: 165 lb 6.4 oz (75 kg)  Height: '5\' 3"'$  (1.6 m)    This visit occurred during the SARS-CoV-2 public health emergency.  Safety protocols were in place, including screening questions prior to the visit, additional usage of staff PPE, and extensive cleaning of exam room while observing appropriate contact time as indicated for disinfecting  solutions.   Assessment & Plan:

## 2021-05-08 LAB — CBC
HCT: 43.5 % (ref 36.0–46.0)
Hemoglobin: 14.7 g/dL (ref 12.0–15.0)
MCHC: 33.8 g/dL (ref 30.0–36.0)
MCV: 91.9 fl (ref 78.0–100.0)
Platelets: 174 10*3/uL (ref 150.0–400.0)
RBC: 4.74 Mil/uL (ref 3.87–5.11)
RDW: 12.4 % (ref 11.5–15.5)
WBC: 4.4 10*3/uL (ref 4.0–10.5)

## 2021-05-08 LAB — HEPATIC FUNCTION PANEL
ALT: 91 U/L — ABNORMAL HIGH (ref 0–35)
AST: 63 U/L — ABNORMAL HIGH (ref 0–37)
Albumin: 4.6 g/dL (ref 3.5–5.2)
Alkaline Phosphatase: 92 U/L (ref 39–117)
Bilirubin, Direct: 0.1 mg/dL (ref 0.0–0.3)
Total Bilirubin: 0.6 mg/dL (ref 0.2–1.2)
Total Protein: 7.4 g/dL (ref 6.0–8.3)

## 2021-05-08 LAB — LIPID PANEL
Cholesterol: 243 mg/dL — ABNORMAL HIGH (ref 0–200)
HDL: 42.7 mg/dL (ref >39.00)
NonHDL: 200
Total CHOL/HDL Ratio: 6
Triglycerides: 391 mg/dL — ABNORMAL HIGH (ref 0.0–149.0)
VLDL: 78.2 mg/dL — ABNORMAL HIGH (ref 0.0–40.0)

## 2021-05-08 LAB — BASIC METABOLIC PANEL
BUN: 15 mg/dL (ref 6–23)
CO2: 25 mEq/L (ref 19–32)
Calcium: 10.6 mg/dL — ABNORMAL HIGH (ref 8.4–10.5)
Chloride: 101 mEq/L (ref 96–112)
Creatinine, Ser: 0.86 mg/dL (ref 0.40–1.20)
GFR: 70.11 mL/min (ref >60.00)
Glucose, Bld: 85 mg/dL (ref 70–99)
Potassium: 4.1 mEq/L (ref 3.5–5.1)
Sodium: 140 mEq/L (ref 135–145)

## 2021-05-08 LAB — LDL CHOLESTEROL, DIRECT: Direct LDL: 168 mg/dL

## 2021-05-08 LAB — HEMOGLOBIN A1C: Hgb A1c MFr Bld: 5.3 % (ref 4.6–6.5)

## 2021-05-10 NOTE — Assessment & Plan Note (Signed)
[place] 2016 with possible fatty liver disease. Needs hepatic function and repeat imaging if persistently abnormal. We also discussed GI referral to rule out alternate possibility if imaging if not consistent or there is worsening to assess for other causes of liver elevation.

## 2021-05-10 NOTE — Assessment & Plan Note (Signed)
Takes diazepam 5 mg BID and will continue for now.

## 2021-05-10 NOTE — Assessment & Plan Note (Signed)
BP at goal on amlodipine 10 mg daily.

## 2021-05-10 NOTE — Assessment & Plan Note (Signed)
Checking lipid panel and not on meds currently.

## 2021-05-13 DIAGNOSIS — Z1211 Encounter for screening for malignant neoplasm of colon: Secondary | ICD-10-CM | POA: Diagnosis not present

## 2021-05-14 ENCOUNTER — Encounter: Payer: Self-pay | Admitting: Internal Medicine

## 2021-05-14 ENCOUNTER — Other Ambulatory Visit: Payer: Self-pay | Admitting: Internal Medicine

## 2021-05-14 DIAGNOSIS — R7989 Other specified abnormal findings of blood chemistry: Secondary | ICD-10-CM

## 2021-05-14 MED ORDER — ROSUVASTATIN CALCIUM 20 MG PO TABS: 20.0000 mg | ORAL_TABLET | Freq: Every day | ORAL | 3 refills | Status: DC

## 2021-05-18 LAB — COLOGUARD: Cologuard: POSITIVE — AB

## 2021-05-21 ENCOUNTER — Other Ambulatory Visit: Payer: Self-pay | Admitting: Internal Medicine

## 2021-05-21 DIAGNOSIS — R195 Other fecal abnormalities: Secondary | ICD-10-CM

## 2021-06-03 ENCOUNTER — Other Ambulatory Visit: Payer: Self-pay

## 2021-06-03 ENCOUNTER — Telehealth (INDEPENDENT_AMBULATORY_CARE_PROVIDER_SITE_OTHER): Payer: PPO | Admitting: Family Medicine

## 2021-06-03 DIAGNOSIS — U071 COVID-19: Secondary | ICD-10-CM | POA: Diagnosis not present

## 2021-06-03 MED ORDER — NIRMATRELVIR/RITONAVIR (PAXLOVID)TABLET: 3.0000 | ORAL_TABLET | Freq: Two times a day (BID) | ORAL | 0 refills | Status: AC

## 2021-06-03 NOTE — Progress Notes (Signed)
Patient ID: Angela Graves, female   DOB: 1956-06-18, 65 y.o.   MRN: [id number]   This visit type was conducted due to national recommendations for restrictions regarding the COVID-19 pandemic in an effort to limit this patient's exposure and mitigate transmission in our community.   Virtual Visit via Video Note  I connected with Delesa Kawa on 06/03/21 at 11:45 AM EDT by a video enabled telemedicine application and verified that I am speaking with the correct person using two identifiers.  Location patient: home Location provider:work or home office Persons participating in the virtual visit: patient, provider  I discussed the limitations of evaluation and management by telemedicine and the availability of in person appointments. The patient expressed understanding and agreed to proceed.   HPI:  Angela Graves has COVID-19 infection.  She states that she developed some sinus congestion around Friday of last week.  By Saturday she developed fever, cough, body aches, chills.  She has felt somewhat worse each day.  Over the weekend she had fever as high as 101.7.  Is keeping down fluids.  Denies any vomiting or diarrhea.  She has had some nausea and had some leftover Phenergan which helps.  She and her husband just got back from a cruise to [place] last week and she thinks she probably picked up something then.  He is asymptomatic at this time.  Patient has history of hypertension and hyperlipidemia.  She takes Crestor and amlodipine.   ROS: See pertinent positives and negatives per HPI.  Past Medical History:  Diagnosis Date   Anxiety    Migraine     Past Surgical History:  Procedure Laterality Date   ABDOMINAL HYSTERECTOMY Bilateral 1979   complete with BSO due to endometriosis and ovarian cysts   APPENDECTOMY     CHOLECYSTECTOMY     JOINT REPLACEMENT     KNEE SURGERY      Family History  Adopted: Yes  Family history unknown: Yes    SOCIAL HX: Former smoker   Current Outpatient  Medications:    nirmatrelvir/ritonavir EUA (PAXLOVID) 20 x 150 MG & 10 x 100MG  TABS, Take 3 tablets by mouth 2 (two) times daily for 5 days. (Take nirmatrelvir 150 mg two tablets twice daily for 5 days and ritonavir 100 mg one tablet twice daily for 5 days) Patient GFR is 70, Disp: 30 tablet, Rfl: 0   amLODipine (NORVASC) 10 MG tablet, TAKE 1 TABLET(10 MG) BY MOUTH DAILY, Disp: 90 tablet, Rfl: 3   cholecalciferol (VITAMIN D) 1000 units tablet, Take 1,000 Units by mouth daily., Disp: , Rfl:    diazepam (VALIUM) 5 MG tablet, TAKE 1 TABLET(5 MG) BY MOUTH EVERY 12 HOURS AS NEEDED FOR ANXIETY, Disp: 60 tablet, Rfl: 5   promethazine (PHENERGAN) 25 MG tablet, Take 1 tablet (25 mg total) by mouth every 8 (eight) hours as needed for nausea (or migraine)., Disp: 30 tablet, Rfl: 1   rosuvastatin (CRESTOR) 20 MG tablet, Take 1 tablet (20 mg total) by mouth daily., Disp: 90 tablet, Rfl: 3  EXAM:  VITALS per patient if applicable:  GENERAL: alert, oriented, appears well and in no acute distress  HEENT: atraumatic, conjunttiva clear, no obvious abnormalities on inspection of external nose and ears  NECK: normal movements of the head and neck  LUNGS: on inspection no signs of respiratory distress, breathing rate appears normal, no obvious gross SOB, gasping or wheezing  CV: no obvious cyanosis  MS: moves all visible extremities without noticeable abnormality  PSYCH/NEURO: pleasant and  cooperative, no obvious depression or anxiety, speech and thought processing grossly intact  ASSESSMENT AND PLAN:  Discussed the following assessment and plan:   COVID-19 infection.  Patient is nontoxic in appearance but has moderately severe symptoms.  We discussed the following items  -Continue plenty fluids and rest -Follow-up immediately for increased dyspnea or other concerns -We did discuss antiviral therapy.  She would like to consider.  We discussed starting Paxlovid.  Patient does have history of normal  renal function with GFR of 70 on labs few weeks ago.  We did instruct her to hold the Crestor while she is taking medication and reduce amlodipine to half dosage just for the 5 days that she is taking the medication    I discussed the assessment and treatment plan with the patient. The patient was provided an opportunity to ask questions and all were answered. The patient agreed with the plan and demonstrated an understanding of the instructions.   The patient was advised to call back or seek an in-person evaluation if the symptoms worsen or if the condition fails to improve as anticipated.     Carolann Littler, MD

## 2021-06-04 ENCOUNTER — Inpatient Hospital Stay: Admission: RE | Admit: 2021-06-04 | Payer: PPO | Source: Ambulatory Visit

## 2021-06-10 ENCOUNTER — Other Ambulatory Visit: Payer: PPO

## 2021-06-12 DIAGNOSIS — Z20822 Contact with and (suspected) exposure to covid-19: Secondary | ICD-10-CM | POA: Diagnosis not present

## 2021-06-13 ENCOUNTER — Telehealth (INDEPENDENT_AMBULATORY_CARE_PROVIDER_SITE_OTHER): Payer: PPO | Admitting: Nurse Practitioner

## 2021-06-13 ENCOUNTER — Telehealth: Payer: Self-pay | Admitting: Internal Medicine

## 2021-06-13 ENCOUNTER — Encounter: Payer: Self-pay | Admitting: Nurse Practitioner

## 2021-06-13 DIAGNOSIS — J0100 Acute maxillary sinusitis, unspecified: Secondary | ICD-10-CM | POA: Diagnosis not present

## 2021-06-13 MED ORDER — AMOXICILLIN-POT CLAVULANATE 875-125 MG PO TABS: 1.0000 | ORAL_TABLET | Freq: Two times a day (BID) | ORAL | 0 refills | Status: DC

## 2021-06-13 NOTE — Progress Notes (Signed)
Due to national recommendations of social distancing related to the [place] pandemic, an audio-only tele-health visit was felt to be the most appropriate encounter type for this patient today. I connected with  Angela Graves on 06/13/21 utilizing audio-only technology and verified that I am speaking with the correct person using two identifiers. The patient was located at their home, and I was located at the office of [place] at [address] [facility] during the encounter. I discussed the limitations of evaluation and management by telemedicine. The patient expressed understanding and agreed to proceed.    Subjective:  Patient ID: Angela Graves, female    DOB: [birth year]  Age: 65 y.o. MRN: [id number]  CC:  Chief Complaint  Patient presents with   Sinusitis      HPI  This patient arrives today for the above.  She tells me she had COVID-19 infection and tested positive back on September 18.  She did complete a course of Paxil bid.  She tells me overall her COVID symptoms are improved but she is experiencing maxillary sinus pain especially on the left side.  She also feels quite congested.  She is concerned she has a sinus infection at this time.  She also continues to cough and feels quite fatigued but denies any shortness of breath.  She is taking DayQuil and Zyrtec over-the-counter without much improvement in her symptoms.  She tells me she checked her temperature today and did not run a fever.  Past Medical History:  Diagnosis Date   Anxiety    Migraine       Family History  Adopted: Yes  Family history unknown: Yes    Social History   Social History Narrative   Not on file   Social History   Tobacco Use   Smoking status: Former   Smokeless tobacco: Former  Substance Use Topics   Alcohol use: Yes    Alcohol/week: 7.0 standard drinks    Types: 5 Glasses of wine, 2 Standard drinks or equivalent per week    Comment: socially     Current Meds  Medication  Sig   amLODipine (NORVASC) 10 MG tablet TAKE 1 TABLET(10 MG) BY MOUTH DAILY   amoxicillin-clavulanate (AUGMENTIN) 875-125 MG tablet Take 1 tablet by mouth 2 (two) times daily.   cholecalciferol (VITAMIN D) 1000 units tablet Take 1,000 Units by mouth daily.   diazepam (VALIUM) 5 MG tablet TAKE 1 TABLET(5 MG) BY MOUTH EVERY 12 HOURS AS NEEDED FOR ANXIETY   rosuvastatin (CRESTOR) 20 MG tablet Take 1 tablet (20 mg total) by mouth daily.    ROS:  Review of Systems  Constitutional:  Positive for malaise/fatigue. Negative for fever.  HENT:  Positive for congestion and sinus pain.   Respiratory:  Positive for cough (improving) and sputum production. Negative for shortness of breath.   Cardiovascular:  Negative for chest pain.  Gastrointestinal:  Negative for abdominal pain, diarrhea, nausea and vomiting.  Neurological:  Positive for headaches. Negative for dizziness.    Objective:   Today's Vitals: There were no vitals taken for this visit. Vitals with BMI 05/07/2021 12/29/2019 03/21/2019  Height 5\' 3"  5\' 3"  5\' 3"   Weight 165 lbs 6 oz - 157 lbs 10 oz  BMI 11.94 - 17.40  Systolic 814 - 481  Diastolic 74 - 80  Pulse 76 - 70     Physical Exam Comprehensive physical exam not completed today as office visit was conducted remotely.  She sounds quite congested over the  phone, but she was able to speak in complete sentences without having to stop to breathe.  She did cough a couple times during the visit.  Patient was alert and oriented, and appeared to have appropriate judgment.       Assessment and Plan   1. Acute non-recurrent maxillary sinusitis      Plan: 1.  We will treat her for sinusitis with Augmentin.  Will prescribe 5-day course.  She was encouraged to continue taking her DayQuil and Zyrtec as needed for symptom management.  We did discuss possibly using some nasal sprays such as Flonase nasal spray for her nasal congestion but she tells me she prefers not to take nasal sprays.   Thus will not recommend taking it unless she wants to try it.  She was told if symptoms persist or worsen over the weekend to call us first thing Monday morning at which point may consider getting chest x-ray.  She tells me she understands.   Tests ordered No orders of the defined types were placed in this encounter.     Meds ordered this encounter  Medications   amoxicillin-clavulanate (AUGMENTIN) 875-125 MG tablet    Sig: Take 1 tablet by mouth 2 (two) times daily.    Dispense:  10 tablet    Refill:  0    Order Specific Question:   Supervising Provider    Answer:   Binnie Rail [id number]    Patient to follow-up as needed.  Video was attempted, unfortunately we had technical difficulties so we were unable to connect on video but I did speak to her on telephone for 16 minutes and 21 seconds.  Ailene Ards, NP

## 2021-06-13 NOTE — Telephone Encounter (Signed)
Patient calling in complaining of being severely sick.Marland Kitchen COVID+ & had VV w/ provider 09.19.22  Patient says she is still testing positive today 09.29.22 & is still feeling miserable.. very congested & not feeling well  Antiviral took away the fevers but nothing else  Patient would like for something else to be sent to pharmacy if possible  Pharmacy:  [facility], [place] AT [address]  Phone:  [phone] Fax:  [phone]

## 2021-06-13 NOTE — Telephone Encounter (Signed)
Pt is being seen virtually today at 5 pm by Jeralyn Ruths, NP

## 2021-06-13 NOTE — Telephone Encounter (Signed)
Very sorry, but I can only o/w do symptomatic meds such as for nausea, cough, diarrhea, sob, so just let me know if anything else needed

## 2021-06-14 DIAGNOSIS — Z20822 Contact with and (suspected) exposure to covid-19: Secondary | ICD-10-CM | POA: Diagnosis not present

## 2021-06-17 ENCOUNTER — Telehealth: Payer: PPO | Admitting: Family Medicine

## 2021-06-17 DIAGNOSIS — Z20822 Contact with and (suspected) exposure to covid-19: Secondary | ICD-10-CM | POA: Diagnosis not present

## 2021-06-18 ENCOUNTER — Telehealth: Payer: Self-pay

## 2021-06-18 ENCOUNTER — Ambulatory Visit (INDEPENDENT_AMBULATORY_CARE_PROVIDER_SITE_OTHER): Payer: PPO

## 2021-06-18 DIAGNOSIS — R059 Cough, unspecified: Secondary | ICD-10-CM

## 2021-06-18 DIAGNOSIS — R5383 Other fatigue: Secondary | ICD-10-CM | POA: Diagnosis not present

## 2021-06-18 NOTE — Telephone Encounter (Signed)
Order placed

## 2021-06-18 NOTE — Telephone Encounter (Signed)
Please advise as the pt has stated she was informed to come in today for a chest X-Ray to r/o pneumonia if she was still congested. Pt states she has completed her round of ABX and is still congested. Would like to come in today any time after 1pm for a chest x-ray.  **Please contact pt husband once X-ray has been ordered at [phone].

## 2021-06-18 NOTE — Telephone Encounter (Signed)
I was able to contact pts husband and inform him that Dr. Sharlet Salina has placed the order for the pts chest x-ray.

## 2021-06-19 DIAGNOSIS — Z20822 Contact with and (suspected) exposure to covid-19: Secondary | ICD-10-CM | POA: Diagnosis not present

## 2021-06-20 DIAGNOSIS — Z20822 Contact with and (suspected) exposure to covid-19: Secondary | ICD-10-CM | POA: Diagnosis not present

## 2021-06-21 DIAGNOSIS — Z20822 Contact with and (suspected) exposure to covid-19: Secondary | ICD-10-CM | POA: Diagnosis not present

## 2021-06-23 DIAGNOSIS — Z20822 Contact with and (suspected) exposure to covid-19: Secondary | ICD-10-CM | POA: Diagnosis not present

## 2021-06-26 ENCOUNTER — Other Ambulatory Visit: Payer: Self-pay

## 2021-06-26 ENCOUNTER — Emergency Department (HOSPITAL_BASED_OUTPATIENT_CLINIC_OR_DEPARTMENT_OTHER): Payer: PPO

## 2021-06-26 ENCOUNTER — Emergency Department (HOSPITAL_BASED_OUTPATIENT_CLINIC_OR_DEPARTMENT_OTHER)
Admission: EM | Admit: 2021-06-26 | Discharge: 2021-06-26 | Disposition: A | Discharge status: Home / Self Care | Payer: PPO | Attending: Emergency Medicine | Admitting: Emergency Medicine

## 2021-06-26 ENCOUNTER — Encounter (HOSPITAL_BASED_OUTPATIENT_CLINIC_OR_DEPARTMENT_OTHER): Payer: Self-pay | Admitting: Urology

## 2021-06-26 DIAGNOSIS — Z79899 Other long term (current) drug therapy: Secondary | ICD-10-CM | POA: Diagnosis not present

## 2021-06-26 DIAGNOSIS — K760 Fatty (change of) liver, not elsewhere classified: Secondary | ICD-10-CM | POA: Diagnosis not present

## 2021-06-26 DIAGNOSIS — Z87891 Personal history of nicotine dependence: Secondary | ICD-10-CM | POA: Insufficient documentation

## 2021-06-26 DIAGNOSIS — R1013 Epigastric pain: Secondary | ICD-10-CM

## 2021-06-26 DIAGNOSIS — Z9660 Presence of unspecified orthopedic joint implant: Secondary | ICD-10-CM | POA: Diagnosis not present

## 2021-06-26 DIAGNOSIS — M25511 Pain in right shoulder: Secondary | ICD-10-CM | POA: Diagnosis not present

## 2021-06-26 DIAGNOSIS — R079 Chest pain, unspecified: Secondary | ICD-10-CM | POA: Insufficient documentation

## 2021-06-26 DIAGNOSIS — M549 Dorsalgia, unspecified: Secondary | ICD-10-CM | POA: Diagnosis not present

## 2021-06-26 DIAGNOSIS — S301XXA Contusion of abdominal wall, initial encounter: Secondary | ICD-10-CM | POA: Insufficient documentation

## 2021-06-26 DIAGNOSIS — M533 Sacrococcygeal disorders, not elsewhere classified: Secondary | ICD-10-CM | POA: Insufficient documentation

## 2021-06-26 DIAGNOSIS — R1030 Lower abdominal pain, unspecified: Secondary | ICD-10-CM | POA: Diagnosis not present

## 2021-06-26 DIAGNOSIS — I10 Essential (primary) hypertension: Secondary | ICD-10-CM | POA: Diagnosis not present

## 2021-06-26 DIAGNOSIS — S3991XA Unspecified injury of abdomen, initial encounter: Secondary | ICD-10-CM | POA: Diagnosis present

## 2021-06-26 DIAGNOSIS — Y92410 Unspecified street and highway as the place of occurrence of the external cause: Secondary | ICD-10-CM | POA: Insufficient documentation

## 2021-06-26 DIAGNOSIS — Z041 Encounter for examination and observation following transport accident: Secondary | ICD-10-CM | POA: Diagnosis not present

## 2021-06-26 DIAGNOSIS — R0789 Other chest pain: Secondary | ICD-10-CM | POA: Diagnosis not present

## 2021-06-26 DIAGNOSIS — R110 Nausea: Secondary | ICD-10-CM | POA: Insufficient documentation

## 2021-06-26 DIAGNOSIS — M25512 Pain in left shoulder: Secondary | ICD-10-CM | POA: Diagnosis not present

## 2021-06-26 LAB — CBC WITH DIFFERENTIAL/PLATELET
Abs Immature Granulocytes: 0.03 10*3/uL (ref 0.00–0.07)
Basophils Absolute: 0.1 10*3/uL (ref 0.0–0.1)
Basophils Relative: 1 %
Eosinophils Absolute: 0.1 10*3/uL (ref 0.0–0.5)
Eosinophils Relative: 2 %
HCT: 43.1 % (ref 36.0–46.0)
Hemoglobin: 15 g/dL (ref 12.0–15.0)
Immature Granulocytes: 0 %
Lymphocytes Relative: 19 %
Lymphs Abs: 1.4 10*3/uL (ref 0.7–4.0)
MCH: 31.4 pg (ref 26.0–34.0)
MCHC: 34.8 g/dL (ref 30.0–36.0)
MCV: 90.2 fL (ref 80.0–100.0)
Monocytes Absolute: 0.5 10*3/uL (ref 0.1–1.0)
Monocytes Relative: 7 %
Neutro Abs: 5.4 10*3/uL (ref 1.7–7.7)
Neutrophils Relative %: 71 %
Platelets: 205 10*3/uL (ref 150–400)
RBC: 4.78 MIL/uL (ref 3.87–5.11)
RDW: 12.5 % (ref 11.5–15.5)
WBC: 7.6 10*3/uL (ref 4.0–10.5)
nRBC: 0 % (ref 0.0–0.2)

## 2021-06-26 LAB — BASIC METABOLIC PANEL
Anion gap: 12 (ref 5–15)
BUN: 15 mg/dL (ref 8–23)
CO2: 24 mmol/L (ref 22–32)
Calcium: 9.6 mg/dL (ref 8.9–10.3)
Chloride: 101 mmol/L (ref 98–111)
Creatinine, Ser: 0.8 mg/dL (ref 0.44–1.00)
GFR, Estimated: >60 mL/min (ref >60)
Glucose, Bld: 104 mg/dL — ABNORMAL HIGH (ref 70–99)
Potassium: 4.2 mmol/L (ref 3.5–5.1)
Sodium: 137 mmol/L (ref 135–145)

## 2021-06-26 MED ORDER — PROMETHAZINE HCL 25 MG PO TABS: 25.0000 mg | ORAL_TABLET | Freq: Three times a day (TID) | ORAL | 1 refills | Status: DC | PRN

## 2021-06-26 MED ORDER — IOHEXOL 300 MG/ML  SOLN
100.0000 mL | Freq: Once | INTRAMUSCULAR | Status: AC | PRN
  Administered 2021-06-26: 100 mL via INTRAVENOUS

## 2021-06-26 MED ORDER — ACETAMINOPHEN 500 MG PO TABS
500.0000 mg | ORAL_TABLET | Freq: Once | ORAL | Status: AC
  Administered 2021-06-26: 500 mg via ORAL
  Filled 2021-06-26: qty 1

## 2021-06-26 MED ORDER — OXYCODONE-ACETAMINOPHEN 5-325 MG PO TABS
1.0000 | ORAL_TABLET | Freq: Once | ORAL | Status: DC
  Filled 2021-06-26: qty 1

## 2021-06-26 NOTE — ED Notes (Signed)
Patient transported to CT 
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
## 2021-06-26 NOTE — Discharge Instructions (Addendum)
You may take your home pain medications as prescribed/preferred.  I have written you an additional prescription for some more Phenergan  I do recommend taking Tylenol 1000 mg every 6 hours for pain.  MAOIs return to the ER for any new or concerning symptoms.

## 2021-07-05 ENCOUNTER — Encounter: Payer: Self-pay | Admitting: Internal Medicine

## 2021-07-05 ENCOUNTER — Ambulatory Visit (INDEPENDENT_AMBULATORY_CARE_PROVIDER_SITE_OTHER): Payer: PPO | Admitting: Internal Medicine

## 2021-07-05 ENCOUNTER — Other Ambulatory Visit: Payer: Self-pay

## 2021-07-05 VITALS — BP 130/70 | HR 95 | Ht 63.0 in | Wt 166.0 lb

## 2021-07-05 DIAGNOSIS — I10 Essential (primary) hypertension: Secondary | ICD-10-CM | POA: Diagnosis not present

## 2021-07-05 DIAGNOSIS — M533 Sacrococcygeal disorders, not elsewhere classified: Secondary | ICD-10-CM

## 2021-07-05 DIAGNOSIS — T07XXXA Unspecified multiple injuries, initial encounter: Secondary | ICD-10-CM
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
## 2021-07-06 ENCOUNTER — Encounter: Payer: Self-pay | Admitting: Internal Medicine

## 2021-07-06 DIAGNOSIS — T07XXXA Unspecified multiple injuries, initial encounter: Secondary | ICD-10-CM | POA: Insufficient documentation

## 2021-07-06 DIAGNOSIS — M533 Sacrococcygeal disorders, not elsewhere classified: Secondary | ICD-10-CM | POA: Insufficient documentation

## 2021-07-06 NOTE — Assessment & Plan Note (Signed)
Pain seems out of proportion to expected, I suspect underlying issue such as fx or assocaited hematoma, has some at least mild gait difficulty but declines further consderation such as MRI or pain tx at this time, and will f/u Dr Nelva Bush oct 25 as planned

## 2021-07-06 NOTE — Assessment & Plan Note (Signed)
BP Readings from Last 3 Encounters:  07/05/21 130/70  06/26/21 137/76  05/07/21 116/74   Stable, pt to continue medical treatment norvasc

## 2021-07-06 NOTE — Patient Instructions (Signed)
Please continue all other medications as before, and refills have been done if requested.  Please have the pharmacy call with any other refills you may need.  Please keep your appointments with your specialists as you may have planned  -  dr Nelva Bush Oct 25

## 2021-07-06 NOTE — Assessment & Plan Note (Signed)
With associated hematoma subq - d/w pt natural hx, reassured, no other specific tx needed, for tylenolol prn and expectant management

## 2021-07-09 DIAGNOSIS — M533 Sacrococcygeal disorders, not elsewhere classified: Secondary | ICD-10-CM | POA: Diagnosis not present

## 2021-08-20 DIAGNOSIS — Z23 Encounter for immunization: Secondary | ICD-10-CM | POA: Diagnosis not present

## 2021-09-13 ENCOUNTER — Encounter: Payer: Self-pay | Admitting: Gastroenterology

## 2021-09-18 ENCOUNTER — Other Ambulatory Visit: Payer: Self-pay

## 2021-09-18 ENCOUNTER — Ambulatory Visit
Admission: EM | Admit: 2021-09-18 | Discharge: 2021-09-18 | Disposition: A | Discharge status: Home / Self Care | Payer: PPO | Attending: Internal Medicine | Admitting: Internal Medicine

## 2021-09-18 DIAGNOSIS — J069 Acute upper respiratory infection, unspecified: Secondary | ICD-10-CM | POA: Diagnosis not present

## 2021-09-18 DIAGNOSIS — J029 Acute pharyngitis, unspecified: Secondary | ICD-10-CM | POA: Diagnosis not present

## 2021-09-18 LAB — POCT RAPID STREP A (OFFICE): Rapid Strep A Screen: NEGATIVE

## 2021-09-18 MED ORDER — FLUTICASONE PROPIONATE 50 MCG/ACT NA SUSP: 1.0000 | Freq: Every day | NASAL | 0 refills | Status: DC

## 2021-09-18 MED ORDER — CETIRIZINE HCL 5 MG PO TABS: 5.0000 mg | ORAL_TABLET | Freq: Every day | ORAL | 0 refills | Status: DC

## 2021-09-18 NOTE — Discharge Instructions (Signed)
You have a viral upper respiratory infection that should resolve in the next few days with symptomatic treatment.  You have been prescribed 2 medications to help alleviate this.  Rapid strep is negative.  Throat culture, COVID-19, flu swabs are pending.  Please avoid taking Valium in addition to cetirizine (zyrtec) as this might cause drowsiness.

## 2021-09-18 NOTE — ED Provider Notes (Signed)
[facility]    CSN: [id number] Arrival date & time: 09/18/21  1034      History   Chief Complaint Chief Complaint  Patient presents with   Sore Throat    HPI Angela Graves is a 66 y.o. female.   Patient presents with nasal congestion, dizziness, sore throat, and cough that started this morning.  Cough is intermittent and is nonproductive per patient.  Denies any known fevers.  She has had some sick contacts with similar symptoms recently.  She has taken benadryl for sore throat with minimal improvement.  Denies chest pain, shortness of breath, ear pain, nausea, vomiting, diarrhea, abdominal pain.   Sore Throat   Past Medical History:  Diagnosis Date   Anxiety    Migraine     Patient Active Problem List   Diagnosis Date Noted   Multiple bruises 07/06/2021   Sacral back pain 07/06/2021   Migraine 03/21/2019   Elevated LFTs 03/19/2019   Dyspareunia in female 03/19/2019   Hypertension 04/29/2017   Pure hypercholesterolemia 04/29/2017   Vitamin D deficiency 04/29/2017   Anxiety state 04/29/2017   Chronic constipation 04/29/2017   Fatty liver 04/29/2017    Past Surgical History:  Procedure Laterality Date   ABDOMINAL HYSTERECTOMY Bilateral 1979   complete with BSO due to endometriosis and ovarian cysts   APPENDECTOMY     CHOLECYSTECTOMY     JOINT REPLACEMENT     KNEE SURGERY      OB History   No obstetric history on file.      Home Medications    Prior to Admission medications   Medication Sig Start Date End Date Taking? Authorizing Provider  cetirizine (ZYRTEC) 5 MG tablet Take 1 tablet (5 mg total) by mouth daily for 10 days. 09/18/21 09/28/21 Yes Damieon Armendariz, Michele Rockers, FNP  fluticasone (FLONASE) 50 MCG/ACT nasal spray Place 1 spray into both nostrils daily for 3 days. 09/18/21 09/21/21 Yes Rocklin Soderquist, Hildred Alamin E, FNP  amLODipine (NORVASC) 10 MG tablet TAKE 1 TABLET(10 MG) BY MOUTH DAILY 05/07/21   Hoyt Koch, MD  cholecalciferol (VITAMIN D) 1000  units tablet Take 1,000 Units by mouth daily.    [provider]  diazepam (VALIUM) 5 MG tablet TAKE 1 TABLET(5 MG) BY MOUTH EVERY 12 HOURS AS NEEDED FOR ANXIETY 05/07/21   Hoyt Koch, MD  promethazine (PHENERGAN) 25 MG tablet Take 1 tablet (25 mg total) by mouth every 8 (eight) hours as needed for nausea (or migraine). Patient not taking: Reported on 07/05/2021 06/26/21 04/28/22  Tedd Sias, PA  rosuvastatin (CRESTOR) 20 MG tablet Take 1 tablet (20 mg total) by mouth daily. 05/14/21   Hoyt Koch, MD    Family History Family History  Adopted: Yes  Family history unknown: Yes    Social History Social History   Tobacco Use   Smoking status: Former   Smokeless tobacco: Former  Scientific laboratory technician Use: Never used  Substance Use Topics   Alcohol use: Yes    Alcohol/week: 7.0 standard drinks    Types: 5 Glasses of wine, 2 Standard drinks or equivalent per week    Comment: socially   Drug use: No     Allergies   Codeine and Morphine and related   Review of Systems Review of Systems Per HPI  Physical Exam Triage Vital Signs ED Triage Vitals  Enc Vitals Group     BP 09/18/21 1044 (!) 149/80     Pulse Rate 09/18/21 1044 84  Resp 09/18/21 1044 18     Temp 09/18/21 1044 97.9 F (36.6 C)     Temp Source 09/18/21 1044 Oral     SpO2 09/18/21 1044 97 %     Weight --      Height --      Head Circumference --      Peak Flow --      Pain Score 09/18/21 1045 0     Pain Loc --      Pain Edu? --      Excl. in [place]? --    No data found.  Updated Vital Signs BP (!) 149/80 (BP Location: Left Arm)    Pulse 84    Temp 97.9 F (36.6 C) (Oral)    Resp 18    SpO2 97%   Visual Acuity Right Eye Distance:   Left Eye Distance:   Bilateral Distance:    Right Eye Near:   Left Eye Near:    Bilateral Near:     Physical Exam Constitutional:      General: She is not in acute distress.    Appearance: Normal appearance. She is not toxic-appearing  or diaphoretic.  HENT:     Head: Normocephalic and atraumatic.     Right Ear: Tympanic membrane and ear canal normal.     Left Ear: Tympanic membrane and ear canal normal.     Nose: Congestion present.     Mouth/Throat:     Mouth: Mucous membranes are moist.     Pharynx: Posterior oropharyngeal erythema present.  Eyes:     Extraocular Movements: Extraocular movements intact.     Conjunctiva/sclera: Conjunctivae normal.     Pupils: Pupils are equal, round, and reactive to light.  Cardiovascular:     Rate and Rhythm: Normal rate and regular rhythm.     Pulses: Normal pulses.     Heart sounds: Normal heart sounds.  Pulmonary:     Effort: Pulmonary effort is normal. No respiratory distress.     Breath sounds: Normal breath sounds. No stridor. No wheezing, rhonchi or rales.  Abdominal:     General: Abdomen is flat. Bowel sounds are normal.     Palpations: Abdomen is soft.  Musculoskeletal:        General: Normal range of motion.     Cervical back: Normal range of motion.  Skin:    General: Skin is warm and dry.  Neurological:     General: No focal deficit present.     Mental Status: She is alert and oriented to person, place, and time. Mental status is at baseline.  Psychiatric:        Mood and Affect: Mood normal.        Behavior: Behavior normal.     UC Treatments / Results  Labs (all labs ordered are listed, but only abnormal results are displayed) Labs Reviewed  CULTURE, GROUP A STREP ([place])  COVID-19, FLU A+B NAA  POCT RAPID STREP A (OFFICE)    EKG   Radiology No results found.  Procedures Procedures (including critical care time)  Medications Ordered in UC Medications - No data to display  Initial Impression / Assessment and Plan / UC Course  I have reviewed the triage vital signs and the nursing notes.  Pertinent labs & imaging results that were available during my care of the patient were reviewed by me and considered in my medical decision making (see  chart for details).     Patient presents with symptoms likely from  a viral upper respiratory infection. Differential includes bacterial pneumonia, sinusitis, allergic rhinitis, COVID-19, flu. Do not suspect underlying cardiopulmonary process. Symptoms seem unlikely related to ACS, CHF or COPD exacerbations, pneumonia, pneumothorax. Patient is nontoxic appearing and not in need of emergent medical intervention.  Rapid strep was negative.  Throat culture, COVID-19, flu swab pending.  Recommended symptom control with over the counter medications.  Patient sent prescriptions.  Advised patient to take not take cetirizine and Valium together due to drowsiness risk.  Return if symptoms fail to improve in 1-2 weeks or you develop shortness of breath, chest pain, severe headache. Patient states understanding and is agreeable.  Discharged with PCP followup.  Final Clinical Impressions(s) / UC Diagnoses   Final diagnoses:  Viral upper respiratory infection  Sore throat     Discharge Instructions      You have a viral upper respiratory infection that should resolve in the next few days with symptomatic treatment.  You have been prescribed 2 medications to help alleviate this.  Rapid strep is negative.  Throat culture, COVID-19, flu swabs are pending.  Please avoid taking Valium in addition to cetirizine (zyrtec) as this might cause drowsiness.     ED Prescriptions     Medication Sig Dispense Auth. Provider   cetirizine (ZYRTEC) 5 MG tablet Take 1 tablet (5 mg total) by mouth daily for 10 days. 10 tablet [place], [employer] E, [place]   fluticasone [facility]) 50 MCG/ACT nasal spray Place 1 spray into both nostrils daily for 3 days. 16 g Teodora Medici, [place]      PDMP not reviewed this encounter.   Teodora Medici, [place] 09/18/21 1134

## 2021-09-18 NOTE — ED Triage Notes (Signed)
Pt c/o sore throat, vertigo, cough, nasal congestion,   Denies earache, headache, nausea, vomiting, diarrhea, constipation   Onset ~ today

## 2021-09-19 LAB — COVID-19, FLU A+B NAA
Influenza A, NAA: NOT DETECTED
Influenza B, NAA: NOT DETECTED
SARS-CoV-2, NAA: NOT DETECTED

## 2021-09-21 LAB — CULTURE, GROUP A STREP (THRC)

## 2021-10-28 ENCOUNTER — Other Ambulatory Visit: Payer: Self-pay

## 2021-10-28 ENCOUNTER — Ambulatory Visit (AMBULATORY_SURGERY_CENTER): Payer: PPO | Admitting: *Deleted

## 2021-10-28 ENCOUNTER — Telehealth: Payer: Self-pay | Admitting: Gastroenterology

## 2021-10-28 VITALS — Ht 63.0 in | Wt 160.0 lb

## 2021-10-28 DIAGNOSIS — Z1211 Encounter for screening for malignant neoplasm of colon: Secondary | ICD-10-CM

## 2021-10-28 MED ORDER — PLENVU 140 G PO SOLR: 1.0000 | ORAL | 0 refills | Status: DC

## 2021-10-28 NOTE — Progress Notes (Signed)
No egg or soy allergy known to patient  No issues known to pt with past sedation with any surgeries or procedures Patient denies ever being told they had issues or difficulty with intubation  No FH of Malignant Hyperthermia Pt is not on diet pills Pt is not on  home 02  Pt is not on blood thinners  Pt denies issues with constipation uses Miralax daily and having soft regular BM's  No A fib or A flutter + cologuard  Pt is fully vaccinated  for Covid   NO PA's for preps discussed with pt In PV today  Discussed with pt there will be an out-of-pocket cost for prep and that varies from $0 to 70 +  dollars - pt verbalized understanding  Pt given the option in PV today for Golytely prep verses  alternative prep  ( Suprep/Plenvu)-  Pt is aware the Golytely has more volume but is more cost effective and the Suprep/Plenvu is less volume but may cost $60 with plenvu medicare coupon .  Pt voiced understanding of this and choose Plenvy  Prep with medicare coupon   Due to the COVID-19 pandemic we are asking patients to follow certain guidelines in PV and the [place]   Pt aware of COVID protocols and [facility] guidelines   PV completed over the phone. Pt verified name, DOB, address and insurance during PV today.  Pt mailed instruction packet with copy of consent form to read and not return, and instructions.  Pt encouraged to call with questions or issues.  If pt has My chart, procedure instructions sent via My Chart  Emailed instructions to [email or web address]

## 2021-10-28 NOTE — Telephone Encounter (Signed)
Patient had previsit appt this morning and has additional questions.  Please call.  Thank you.

## 2021-10-28 NOTE — Telephone Encounter (Signed)
Spoke with patient. Patient question if she needed to take dulcolax with Plenvu. Patient was notified not needed.

## 2021-11-07 ENCOUNTER — Encounter: Payer: Self-pay | Admitting: Gastroenterology

## 2021-11-11 ENCOUNTER — Other Ambulatory Visit: Payer: Self-pay

## 2021-11-11 ENCOUNTER — Encounter: Payer: Self-pay | Admitting: Gastroenterology

## 2021-11-11 ENCOUNTER — Ambulatory Visit (AMBULATORY_SURGERY_CENTER): Payer: PPO | Admitting: Gastroenterology

## 2021-11-11 VITALS — BP 126/65 | HR 76 | Temp 97.3°F | Resp 17 | Ht 63.0 in | Wt 160.0 lb

## 2021-11-11 DIAGNOSIS — D125 Benign neoplasm of sigmoid colon: Secondary | ICD-10-CM | POA: Diagnosis not present

## 2021-11-11 DIAGNOSIS — D124 Benign neoplasm of descending colon: Secondary | ICD-10-CM | POA: Diagnosis not present

## 2021-11-11 DIAGNOSIS — D120 Benign neoplasm of cecum: Secondary | ICD-10-CM

## 2021-11-11 DIAGNOSIS — Z1211 Encounter for screening for malignant neoplasm of colon: Secondary | ICD-10-CM | POA: Diagnosis not present

## 2021-11-11 DIAGNOSIS — D123 Benign neoplasm of transverse colon: Secondary | ICD-10-CM

## 2021-11-11 DIAGNOSIS — K6389 Other specified diseases of intestine: Secondary | ICD-10-CM

## 2021-11-11 DIAGNOSIS — F419 Anxiety disorder, unspecified: Secondary | ICD-10-CM | POA: Diagnosis not present

## 2021-11-11 DIAGNOSIS — I10 Essential (primary) hypertension: Secondary | ICD-10-CM | POA: Diagnosis not present

## 2021-11-11 MED ORDER — SODIUM CHLORIDE 0.9 % IV SOLN: 500.0000 mL | Freq: Once | INTRAVENOUS | Status: DC

## 2021-11-11 NOTE — Progress Notes (Signed)
Pt's states no medical or surgical changes since previsit or office visit.   VS taken by DT 

## 2021-11-11 NOTE — Progress Notes (Signed)
Dr Silverio Decamp suggested that the husband come back to recovery due to the diagnosis.  The patient agreed.

## 2021-11-11 NOTE — Progress Notes (Signed)
Sedate, gd SR, tolerated procedure well, VSS, report to RN 

## 2021-11-11 NOTE — Patient Instructions (Addendum)
You had one large polyp that was a little bigger than the rest.   Dr Silverio Decamp will call you with the results.  Read all of the handouts given to you by your recovery room nurse.  Try to limit your NSAIDS for a couple of days.  YOU HAD AN ENDOSCOPIC PROCEDURE TODAY AT [facility]:   Refer to the procedure report that was given to you for any specific questions about what was found during the examination.  If the procedure report does not answer your questions, please call your gastroenterologist to clarify.  If you requested that your care partner not be given the details of your procedure findings, then the procedure report has been included in a sealed envelope for you to review at your convenience later.  YOU SHOULD EXPECT: Some feelings of bloating in the abdomen. Passage of more gas than usual.  Walking can help get rid of the air that was put into your GI tract during the procedure and reduce the bloating. If you had a lower endoscopy (such as a colonoscopy or flexible sigmoidoscopy) you may notice spotting of blood in your stool or on the toilet paper. If you underwent a bowel prep for your procedure, you may not have a normal bowel movement for a few days.  Please Note:  You might notice some irritation and congestion in your nose or some drainage.  This is from the oxygen used during your procedure.  There is no need for concern and it should clear up in a day or so.  SYMPTOMS TO REPORT IMMEDIATELY:  Following lower endoscopy (colonoscopy or flexible sigmoidoscopy):  Excessive amounts of blood in the stool  Significant tenderness or worsening of abdominal pains  Swelling of the abdomen that is new, acute  Fever of 100F or higher   For urgent or emergent issues, a gastroenterologist can be reached at any hour by calling [phone]. Do not use MyChart messaging for urgent concerns.    DIET:  We do recommend a small meal at first, but then you may proceed to your  regular diet.  Drink plenty of fluids but you should avoid alcoholic beverages for 24 hours. Try to increase the fiber I your diet, and drink plenty of water.  ACTIVITY:  You should plan to take it easy for the rest of today and you should NOT DRIVE or use heavy machinery until tomorrow (because of the sedation medicines used during the test).    FOLLOW UP: Our staff will call the number listed on your records 48-72 hours following your procedure to check on you and address any questions or concerns that you may have regarding the information given to you following your procedure. If we do not reach you, we will leave a message.  We will attempt to reach you two times.  During this call, we will ask if you have developed any symptoms of COVID 19. If you develop any symptoms (ie: fever, flu-like symptoms, shortness of breath, cough etc.) before then, please call [phone].  If you test positive for Covid 19 in the 2 weeks post procedure, please call and report this information to [place].    If any biopsies were taken you will be contacted by phone or by letter within the next 1-3 weeks.  Please call us at [phone] if you have not heard about the biopsies in 3 weeks.    SIGNATURES/CONFIDENTIALITY: You and/or your care partner have signed paperwork which will be entered into  your electronic medical record.  These signatures attest to the fact that that the information above on your After Visit Summary has been reviewed and is understood.  Full responsibility of the confidentiality of this discharge information lies with you and/or your care-partner.

## 2021-11-11 NOTE — Op Note (Signed)
[facility] Patient Name: Angela Graves Procedure Date: 11/11/2021 11:20 AM MRN: [id number] Endoscopist: Mauri Pole , MD Age: 66 Referring MD:  Date of Birth: 10/07/1955 Gender: Female Account #: [id number] Procedure:                Colonoscopy Indications:              Screening for colorectal malignant neoplasm Medicines:                Monitored Anesthesia Care Procedure:                Pre-Anesthesia Assessment:                           - Prior to the procedure, a History and Physical                            was performed, and patient medications and                            allergies were reviewed. The patient's tolerance of                            previous anesthesia was also reviewed. The risks                            and benefits of the procedure and the sedation                            options and risks were discussed with the patient.                            All questions were answered, and informed consent                            was obtained. Prior Anticoagulants: The patient has                            taken no previous anticoagulant or antiplatelet                            agents. ASA Grade Assessment: II - A patient with                            mild systemic disease. After reviewing the risks                            and benefits, the patient was deemed in                            satisfactory condition to undergo the procedure.                           After obtaining informed consent, the colonoscope  was passed under direct vision. Throughout the                            procedure, the patient's blood pressure, pulse, and                            oxygen saturations were monitored continuously. The                            [id number]-HQ190L Colonoscope was introduced through the                            anus and advanced to the the cecum, identified by                             appendiceal orifice and ileocecal valve. The                            colonoscopy was performed without difficulty. The                            patient tolerated the procedure well. The quality                            of the bowel preparation was good. The ileocecal                            valve, appendiceal orifice, and rectum were                            photographed. Scope In: 11:30:20 AM Scope Out: 11:56:24 AM Scope Withdrawal Time: 0 hours 20 minutes 29 seconds  Total Procedure Duration: 0 hours 26 minutes 4 seconds  Findings:                 The perianal and digital rectal examinations were                            normal.                           Five semi-pedunculated polyps were found in the                            sigmoid colon X1 , transverse colon X2 and cecum                            X2. The polyps were 8 to 12 mm in size. These                            polyps were removed with a hot snare. Resection and                            retrieval were complete.  A 24 mm polypoid lesion was found in the descending                            colon. The lesion was multi-lobulated. No bleeding                            was present. Biopsies were taken with a cold                            forceps for histology. Area was successfully                            injected with 3 mL Spot (carbon black) for                            tattooing.                           Scattered small and large-mouthed diverticula were                            found in the sigmoid colon, descending colon,                            transverse colon, ascending colon and cecum.                           Non-bleeding external and internal hemorrhoids were                            found during retroflexion. The hemorrhoids were                            medium-sized. Complications:            No immediate complications. Estimated Blood Loss:      Estimated blood loss was minimal. Impression:               - Five 8 to 12 mm polyps in the sigmoid colon, in                            the transverse colon and in the cecum, removed with                            a hot snare. Resected and retrieved.                           - Rule out malignancy, polypoid lesion in the                            descending colon. Biopsied. Injected.                           - Severe diverticulosis in the sigmoid colon, in  the descending colon, in the transverse colon, in                            the ascending colon and in the cecum.                           - Non-bleeding external and internal hemorrhoids. Recommendation:           - Patient has a contact number available for                            emergencies. The signs and symptoms of potential                            delayed complications were discussed with the                            patient. Return to normal activities tomorrow.                            Written discharge instructions were provided to the                            patient.                           - Resume previous diet.                           - Continue present medications.                           - Await pathology results.                           - Repeat colonoscopy date to be determined after                            pending pathology results are reviewed for                            surveillance based on pathology results. Mauri Pole, MD 11/11/2021 12:03:12 PM This report has been signed electronically.

## 2021-11-11 NOTE — Progress Notes (Signed)
[facility] History and Physical   Primary Care Physician:  Hoyt Koch, MD   Reason for Procedure:  Colorectal cancer screening  Plan:    Screening colonoscopy with possible interventions as needed     HPI: Angela Graves is a very pleasant 66 y.o. female here for screening colonoscopy. Denies any nausea, vomiting, abdominal pain, melena or bright red blood per rectum  The risks and benefits as well as alternatives of endoscopic procedure(s) have been discussed and reviewed. All questions answered. The patient agrees to proceed.    Past Medical History:  Diagnosis Date   Allergy    Anxiety    Arthritis    Constipation    uses Miaralax daily - soft regular BM;s daily on Miralax   Hyperlipidemia    on meds   Hypertension    controlled   Migraine    PONV (postoperative nausea and vomiting)    with Morphine   Positive colorectal cancer screening using Cologuard test     Past Surgical History:  Procedure Laterality Date   ABDOMINAL HYSTERECTOMY Bilateral 1979   complete with BSO due to endometriosis and ovarian cysts   APPENDECTOMY     CHOLECYSTECTOMY     KNEE SURGERY Left    meniscus repair    Prior to Admission medications   Medication Sig Start Date End Date Taking? Authorizing Provider  amLODipine (NORVASC) 10 MG tablet TAKE 1 TABLET(10 MG) BY MOUTH DAILY 05/07/21  Yes Hoyt Koch, MD  cholecalciferol (VITAMIN D) 1000 units tablet Take 1,000 Units by mouth daily.   Yes [provider]  diazepam (VALIUM) 5 MG tablet TAKE 1 TABLET(5 MG) BY MOUTH EVERY 12 HOURS AS NEEDED FOR ANXIETY Patient taking differently: Take 5 mg by mouth daily. TAKE 1 TABLET(5 MG) BY MOUTH EVERY 12 HOURS AS NEEDED FOR ANXIETY 05/07/21  Yes Hoyt Koch, MD  polyethylene glycol (MIRALAX / GLYCOLAX) 17 g packet Take 17 g by mouth daily.   Yes [provider]  rosuvastatin (CRESTOR) 20 MG tablet Take 1 tablet (20 mg total) by mouth  daily. 05/14/21  Yes Hoyt Koch, MD  cetirizine (ZYRTEC) 5 MG tablet Take 1 tablet (5 mg total) by mouth daily for 10 days. Patient not taking: Reported on 10/28/2021 09/18/21 09/28/21  Teodora Medici, FNP  fluticasone [facility]) 50 MCG/ACT nasal spray Place 1 spray into both nostrils daily for 3 days. Patient not taking: Reported on 10/28/2021 09/18/21 09/21/21  Teodora Medici, FNP  promethazine (PHENERGAN) 25 MG tablet Take 1 tablet (25 mg total) by mouth every 8 (eight) hours as needed for nausea (or migraine). 06/26/21 04/28/22  Tedd Sias, PA    Current Outpatient Medications  Medication Sig Dispense Refill   amLODipine (NORVASC) 10 MG tablet TAKE 1 TABLET(10 MG) BY MOUTH DAILY 90 tablet 3   cholecalciferol (VITAMIN D) 1000 units tablet Take 1,000 Units by mouth daily.     diazepam (VALIUM) 5 MG tablet TAKE 1 TABLET(5 MG) BY MOUTH EVERY 12 HOURS AS NEEDED FOR ANXIETY (Patient taking differently: Take 5 mg by mouth daily. TAKE 1 TABLET(5 MG) BY MOUTH EVERY 12 HOURS AS NEEDED FOR ANXIETY) 60 tablet 5   polyethylene glycol (MIRALAX / GLYCOLAX) 17 g packet Take 17 g by mouth daily.     rosuvastatin (CRESTOR) 20 MG tablet Take 1 tablet (20 mg total) by mouth daily. 90 tablet 3   cetirizine (ZYRTEC) 5 MG tablet Take 1 tablet (5 mg total) by mouth  daily for 10 days. (Patient not taking: Reported on 10/28/2021) 10 tablet 0   fluticasone (FLONASE) 50 MCG/ACT nasal spray Place 1 spray into both nostrils daily for 3 days. (Patient not taking: Reported on 10/28/2021) 16 g 0   promethazine (PHENERGAN) 25 MG tablet Take 1 tablet (25 mg total) by mouth every 8 (eight) hours as needed for nausea (or migraine). 30 tablet 1   Current Facility-Administered Medications  Medication Dose Route Frequency Provider Last Rate Last Admin   0.9 %  sodium chloride infusion  500 mL Intravenous Once Mauri Pole, MD        Allergies as of 11/11/2021 - Review Complete 11/11/2021  Allergen Reaction Noted    Codeine Itching 03/14/2012   Morphine and related Nausea And Vomiting 03/14/2012    Family History  Adopted: Yes    Social History   Socioeconomic History   Marital status: Married    Spouse name: Not on file   Number of children: 0   Years of education: Not on file   Highest education level: High school graduate  Occupational History   Not on file  Tobacco Use   Smoking status: Former   Smokeless tobacco: Never  Vaping Use   Vaping Use: Never used  Substance and Sexual Activity   Alcohol use: Yes    Alcohol/week: 7.0 standard drinks    Types: 5 Glasses of wine, 2 Standard drinks or equivalent per week    Comment: socially   Drug use: No   Sexual activity: Not Currently    Birth control/protection: Post-menopausal, Surgical    Comment: s/p complete hysterectomy with BSO in her 55s  Other Topics Concern   Not on file  Social History Narrative   Not on file   Social Determinants of Health   Financial Resource Strain: Not on file  Food Insecurity: Not on file  Transportation Needs: Not on file  Physical Activity: Not on file  Stress: Not on file  Social Connections: Not on file  Intimate Partner Violence: Not on file    Review of Systems:  All other review of systems negative except as mentioned in the HPI.  Physical Exam: Vital signs in last 24 hours: BP (!) 142/81    Pulse 83    Temp (!) 97.3 F (36.3 C)    Ht 5\' 3"  (1.6 m)    Wt 160 lb (72.6 kg)    SpO2 99%    BMI 28.34 kg/m  General:   Alert, NAD Lungs:  Clear .   Heart:  Regular rate and rhythm Abdomen:  Soft, nontender and nondistended. Neuro/Psych:  Alert and cooperative. Normal mood and affect. A and O x 3  Reviewed labs, radiology imaging, old records and pertinent past GI work up  Patient is appropriate for planned procedure(s) and anesthesia in an ambulatory setting   K. Denzil Magnuson , MD [phone]

## 2021-11-13 ENCOUNTER — Telehealth: Payer: Self-pay

## 2021-11-13 ENCOUNTER — Telehealth: Payer: Self-pay | Admitting: *Deleted

## 2021-11-13 NOTE — Telephone Encounter (Signed)
Left message on f/u call 

## 2021-11-13 NOTE — Telephone Encounter (Signed)
Called [phone] and left a message we tried to reach pt for a follow up call. maw  ?

## 2021-11-19 ENCOUNTER — Telehealth: Payer: Self-pay | Admitting: Gastroenterology

## 2021-11-19 NOTE — Telephone Encounter (Signed)
Patient calling to get path results said she has been waiting. ?

## 2021-11-20 ENCOUNTER — Other Ambulatory Visit: Payer: Self-pay

## 2021-11-20 DIAGNOSIS — K6389 Other specified diseases of intestine: Secondary | ICD-10-CM

## 2021-11-21 ENCOUNTER — Other Ambulatory Visit: Payer: Self-pay

## 2021-11-21 DIAGNOSIS — K6389 Other specified diseases of intestine: Secondary | ICD-10-CM

## 2021-11-21 MED ORDER — PLENVU 140 G PO SOLR: ORAL | Status: DC

## 2021-11-24 ENCOUNTER — Other Ambulatory Visit: Payer: Self-pay | Admitting: Internal Medicine

## 2021-11-24 DIAGNOSIS — F411 Generalized anxiety disorder: Secondary | ICD-10-CM

## 2021-11-25 NOTE — Telephone Encounter (Signed)
Instructions for the colonoscopy on 01/07/22 mailed to the patient. ?

## 2021-11-27 NOTE — Telephone Encounter (Signed)
Needs visit for further refills please schedule within 1 month. ?

## 2021-11-28 ENCOUNTER — Other Ambulatory Visit: Payer: Self-pay

## 2021-11-28 MED ORDER — PLENVU 140 G PO SOLR: ORAL | Status: AC

## 2021-12-31 ENCOUNTER — Encounter (HOSPITAL_COMMUNITY): Payer: Self-pay | Admitting: Gastroenterology

## 2021-12-31 NOTE — Progress Notes (Signed)
Attempted to obtain medical history via telephone, unable to reach at this time. I left a voicemail to return pre surgical testing department's phone call.  

## 2022-01-07 ENCOUNTER — Encounter (HOSPITAL_COMMUNITY): Payer: Self-pay | Admitting: Gastroenterology

## 2022-01-07 ENCOUNTER — Ambulatory Visit (HOSPITAL_COMMUNITY)
Admission: RE | Admit: 2022-01-07 | Discharge: 2022-01-07 | Disposition: A | Discharge status: Home / Self Care | Payer: PPO | Source: Ambulatory Visit | Attending: Gastroenterology | Admitting: Gastroenterology

## 2022-01-07 ENCOUNTER — Ambulatory Visit (HOSPITAL_COMMUNITY): Payer: PPO | Admitting: Anesthesiology

## 2022-01-07 ENCOUNTER — Ambulatory Visit (HOSPITAL_BASED_OUTPATIENT_CLINIC_OR_DEPARTMENT_OTHER): Payer: PPO | Admitting: Anesthesiology

## 2022-01-07 ENCOUNTER — Encounter (HOSPITAL_COMMUNITY)
Admission: RE | Disposition: A | Discharge status: Home / Self Care | Payer: Self-pay | Source: Ambulatory Visit | Attending: Gastroenterology

## 2022-01-07 DIAGNOSIS — K5790 Diverticulosis of intestine, part unspecified, without perforation or abscess without bleeding: Secondary | ICD-10-CM | POA: Diagnosis not present

## 2022-01-07 DIAGNOSIS — D124 Benign neoplasm of descending colon: Secondary | ICD-10-CM

## 2022-01-07 DIAGNOSIS — K5730 Diverticulosis of large intestine without perforation or abscess without bleeding: Secondary | ICD-10-CM

## 2022-01-07 DIAGNOSIS — Z87891 Personal history of nicotine dependence: Secondary | ICD-10-CM | POA: Insufficient documentation

## 2022-01-07 DIAGNOSIS — K648 Other hemorrhoids: Secondary | ICD-10-CM

## 2022-01-07 DIAGNOSIS — I10 Essential (primary) hypertension: Secondary | ICD-10-CM

## 2022-01-07 DIAGNOSIS — F419 Anxiety disorder, unspecified: Secondary | ICD-10-CM | POA: Diagnosis not present

## 2022-01-07 DIAGNOSIS — K635 Polyp of colon: Secondary | ICD-10-CM

## 2022-01-07 DIAGNOSIS — D123 Benign neoplasm of transverse colon: Secondary | ICD-10-CM

## 2022-01-07 DIAGNOSIS — D121 Benign neoplasm of appendix: Secondary | ICD-10-CM | POA: Diagnosis not present

## 2022-01-07 DIAGNOSIS — D126 Benign neoplasm of colon, unspecified: Secondary | ICD-10-CM | POA: Diagnosis present

## 2022-01-07 HISTORY — PX: HEMOSTASIS CLIP PLACEMENT: SHX6857

## 2022-01-07 HISTORY — PX: COLONOSCOPY WITH PROPOFOL: SHX5780

## 2022-01-07 HISTORY — PX: BIOPSY: SHX5522

## 2022-01-07 HISTORY — PX: SUBMUCOSAL LIFTING INJECTION: SHX6855

## 2022-01-07 HISTORY — PX: POLYPECTOMY: SHX5525

## 2022-01-07 SURGERY — COLONOSCOPY WITH PROPOFOL
Anesthesia: Monitor Anesthesia Care

## 2022-01-07 MED ORDER — PROPOFOL 500 MG/50ML IV EMUL
INTRAVENOUS | Status: DC | PRN
  Administered 2022-01-07: 125 ug/kg/min via INTRAVENOUS

## 2022-01-07 MED ORDER — PROPOFOL 10 MG/ML IV BOLUS
INTRAVENOUS | Status: DC | PRN
  Administered 2022-01-07 (×2): 20 mg via INTRAVENOUS
  Administered 2022-01-07: 30 mg via INTRAVENOUS

## 2022-01-07 MED ORDER — PROPOFOL 10 MG/ML IV BOLUS
INTRAVENOUS | Status: AC
  Filled 2022-01-07: qty 20

## 2022-01-07 MED ORDER — LACTATED RINGERS IV SOLN
INTRAVENOUS | Status: AC | PRN
  Administered 2022-01-07: 1000 mL via INTRAVENOUS

## 2022-01-07 MED ORDER — LACTATED RINGERS IV SOLN: INTRAVENOUS | Status: DC | PRN

## 2022-01-07 SURGICAL SUPPLY — 22 items

## 2022-01-07 NOTE — Anesthesia Preprocedure Evaluation (Addendum)
Anesthesia Evaluation  ?Patient identified by MRN, date of birth, ID band ?Patient awake ? ? ? ?Reviewed: ?Allergy & Precautions, NPO status , Patient's Chart, lab work & pertinent test results ? ?History of Anesthesia Complications ?(+) PONV and history of anesthetic complications ? ?Airway ?Mallampati: II ? ?TM Distance: >3 FB ?Neck ROM: Full ? ? ? Dental ? ?(+) Missing,  ?  ?Pulmonary ?former smoker,  ?  ?Pulmonary exam normal ? ? ? ? ? ? ? Cardiovascular ?hypertension, Pt. on medications ?Normal cardiovascular exam ? ? ?  ?Neuro/Psych ? Headaches, Anxiety   ? GI/Hepatic ?negative GI ROS, Neg liver ROS,   ?Endo/Other  ?negative endocrine ROS ? Renal/GU ?negative Renal ROS  ?negative genitourinary ?  ?Musculoskeletal ? ?(+) Arthritis ,  ? Abdominal ?  ?Peds ? Hematology ?negative hematology ROS ?(+)   ?Anesthesia Other Findings ?Day of surgery medications reviewed with patient. ? Reproductive/Obstetrics ?negative OB ROS ? ?  ? ? ? ? ? ? ? ? ? ? ? ? ? ?  ?  ? ? ? ? ? ? ? ?Anesthesia Physical ?Anesthesia Plan ? ?ASA: 2 ? ?Anesthesia Plan: MAC  ? ?Post-op Pain Management: Minimal or no pain anticipated  ? ?Induction:  ? ?PONV Risk Score and Plan: 3 and Treatment may vary due to age or medical condition and Propofol infusion ? ?Airway Management Planned: Natural Airway and Simple Face Mask ? ?Additional Equipment: None ? ?Intra-op Plan:  ? ?Post-operative Plan:  ? ?Informed Consent: I have reviewed the patients History and Physical, chart, labs and discussed the procedure including the risks, benefits and alternatives for the proposed anesthesia with the patient or authorized representative who has indicated his/her understanding and acceptance.  ? ? ? ? ? ?Plan Discussed with: CRNA ? ?Anesthesia Plan Comments:   ? ? ? ? ? ?Anesthesia Quick Evaluation ? ?

## 2022-01-07 NOTE — H&P (Signed)
Angela Graves History and Physical ? ? ?Primary Care Physician:  Hoyt Koch, MD ? ? ?Reason for Procedure:   Colon polyp excision by EMR ? ?Plan:    Colonoscopy with possible interventions/EMR ? ? ? ? ?HPI: Angela Graves is a 66 y.o. female here for excision of large colon adenomatous polyp by EMR.  ?Denies any nausea, vomiting, abdominal pain, melena or bright red blood per rectum ? ?The risks and benefits as well as alternatives of endoscopic procedure(s) have been discussed and reviewed. All questions answered. The patient agrees to proceed. ? ? ? ?Past Medical History:  ?Diagnosis Date  ? Allergy   ? Anxiety   ? Arthritis   ? Constipation   ? uses Miaralax daily - soft regular BM;s daily on Miralax  ? Hyperlipidemia   ? on meds  ? Hypertension   ? controlled  ? Migraine   ? PONV (postoperative nausea and vomiting)   ? with Morphine  ? Positive colorectal cancer screening using Cologuard test   ? ? ?Past Surgical History:  ?Procedure Laterality Date  ? ABDOMINAL HYSTERECTOMY Bilateral 1979  ? complete with BSO due to endometriosis and ovarian cysts  ? APPENDECTOMY    ? CHOLECYSTECTOMY    ? KNEE SURGERY Left   ? meniscus repair  ? ? ?Prior to Admission medications   ?Medication Sig Start Date End Date Taking? Authorizing Provider  ?amLODipine (NORVASC) 10 MG tablet TAKE 1 TABLET(10 MG) BY MOUTH DAILY 05/07/21  Yes Hoyt Koch, MD  ?cetirizine (ZYRTEC) 10 MG tablet Take 10 mg by mouth daily as needed for allergies.   Yes [provider]  ?cholecalciferol (VITAMIN D) 1000 units tablet Take 1,000 Units by mouth daily.   Yes [provider]  ?diazepam (VALIUM) 5 MG tablet TAKE 1 TABLET(5 MG) BY MOUTH EVERY 12 HOURS AS NEEDED FOR ANXIETY ?Patient taking differently: Take 5 mg by mouth daily. 11/27/21  Yes Hoyt Koch, MD  ?polyethylene glycol (MIRALAX / GLYCOLAX) 17 g packet Take 17 g by mouth daily.   Yes [provider]  ?promethazine (PHENERGAN) 25  MG tablet Take 1 tablet (25 mg total) by mouth every 8 (eight) hours as needed for nausea (or migraine). 06/26/21 04/28/22 Yes Tedd Sias, PA  ?rosuvastatin (CRESTOR) 20 MG tablet Take 1 tablet (20 mg total) by mouth daily. 05/14/21  Yes Hoyt Koch, MD  ?PEG-KCl-NaCl-NaSulf-Na Asc-C (PLENVU) 140 g SOLR Take as directed by physician as split dosing 11/28/21 01/15/22  Mauri Pole, MD  ? ? ?No current facility-administered medications for this encounter.  ? ? ?Allergies as of 11/20/2021 - Review Complete 11/11/2021  ?Allergen Reaction Noted  ? Codeine Itching 03/14/2012  ? Morphine and related Nausea And Vomiting 03/14/2012  ? ? ?Family History  ?Adopted: Yes  ? ? ?Social History  ? ?Socioeconomic History  ? Marital status: Married  ?  Spouse name: Not on file  ? Number of children: 0  ? Years of education: Not on file  ? Highest education level: High school graduate  ?Occupational History  ? Not on file  ?Tobacco Use  ? Smoking status: Former  ? Smokeless tobacco: Never  ?Vaping Use  ? Vaping Use: Never used  ?Substance and Sexual Activity  ? Alcohol use: Yes  ?  Alcohol/week: 7.0 standard drinks  ?  Types: 5 Glasses of wine, 2 Standard drinks or equivalent per week  ?  Comment: socially  ? Drug use: No  ? Sexual  activity: Not Currently  ?  Birth control/protection: Post-menopausal, Surgical  ?  Comment: s/p complete hysterectomy with BSO in her 25s  ?Other Topics Concern  ? Not on file  ?Social History Narrative  ? Not on file  ? ?Social Determinants of Health  ? ?Financial Resource Strain: Not on file  ?Food Insecurity: Not on file  ?Transportation Needs: Not on file  ?Physical Activity: Not on file  ?Stress: Not on file  ?Social Connections: Not on file  ?Intimate Partner Violence: Not on file  ? ? ?Review of Systems: ? ?All other review of systems negative except as mentioned in the HPI. ? ?Physical Exam: ?Vital signs in last 24 hours: ?  ?  ?General:   Alert, NAD ?Lungs:  Clear .   ?Heart:   Regular rate and rhythm ?Abdomen:  Soft, nontender and nondistended. ?Neuro/Psych:  Alert and cooperative. Normal mood and affect. A and O x 3 ? ? ?K. Denzil Magnuson , MD ?[phone]  ? ? ?  ?

## 2022-01-07 NOTE — Anesthesia Postprocedure Evaluation (Signed)
Anesthesia Post Note ? ?Patient: Angela Graves ? ?Procedure(s) Performed: COLONOSCOPY WITH PROPOFOL ?POLYPECTOMY ?SUBMUCOSAL LIFTING INJECTION ?BIOPSY ?HEMOSTASIS CLIP PLACEMENT ? ?  ? ?Patient location during evaluation: PACU ?Anesthesia Type: MAC ?Level of consciousness: awake and alert ?Pain management: pain level controlled ?Vital Signs Assessment: post-procedure vital signs reviewed and stable ?Respiratory status: spontaneous breathing, nonlabored ventilation and respiratory function stable ?Cardiovascular status: blood pressure returned to baseline ?Postop Assessment: no apparent nausea or vomiting ?Anesthetic complications: no ? ? ?No notable events documented. ? ?Last Vitals:  ?Vitals:  ? 01/07/22 0950 01/07/22 1000  ?BP: 136/70 139/68  ?Pulse: 77 77  ?Resp: 20   ?Temp:    ?SpO2: 96%   ?  ?Last Pain:  ?Vitals:  ? 01/07/22 1000  ?TempSrc:   ?PainSc: 0-No pain  ? ? ?  ?  ?  ?  ?  ?  ? ?Marthenia Rolling ? ? ? ? ?

## 2022-01-07 NOTE — Transfer of Care (Signed)
Immediate Anesthesia Transfer of Care Note ? ?Patient: Angela Graves ? ?Procedure(s) Performed: COLONOSCOPY WITH PROPOFOL ?POLYPECTOMY ?SUBMUCOSAL LIFTING INJECTION ?BIOPSY ?HEMOSTASIS CLIP PLACEMENT ? ?Patient Location: PACU ? ?Anesthesia Type:MAC ? ?Level of Consciousness: awake, alert , oriented and patient cooperative ? ?Airway & Oxygen Therapy: Patient Spontanous Breathing and Patient connected to face mask oxygen ? ?Post-op Assessment: Report given to RN and Post -op Vital signs reviewed and stable ? ?Post vital signs: Reviewed and stable ? ?Last Vitals:  ?Vitals Value Taken Time  ?BP 122/63 01/07/22 0936  ?Temp    ?Pulse 74 01/07/22 0936  ?Resp 18 01/07/22 0936  ?SpO2 100 % 01/07/22 0936  ? ? ?Last Pain:  ?Vitals:  ? 01/07/22 0819  ?TempSrc: Temporal  ?PainSc: 0-No pain  ?   ? ?  ? ?Complications: No notable events documented. ?

## 2022-01-07 NOTE — Op Note (Addendum)
[facility] ?Patient Name: Angela Graves ?Procedure Date: 01/07/2022 ?MRN: [id number] ?Attending MD: Mauri Pole , MD ?Date of Birth: 09/29/1955 ?CSN: [id number] ?Age: 66 ?Admit Type: Outpatient ?Procedure:                Colonoscopy ?Indications:              Therapeutic procedure for colon polyps, Therapeutic  ?                          procedure for known colon adenoma ?Providers:                Mauri Pole, MD, Ladoris Gene, RN,  ?                          Despina Pole, Technician, Cleda Daub, CRNA ?Referring MD:              ?Medicines:                Monitored Anesthesia Care ?Complications:            No immediate complications. ?Estimated Blood Loss:     Estimated blood loss was minimal. ?Procedure:                Pre-Anesthesia Assessment: ?                          - Prior to the procedure, a History and Physical  ?                          was performed, and patient medications and  ?                          allergies were reviewed. The patient's tolerance of  ?                          previous anesthesia was also reviewed. The risks  ?                          and benefits of the procedure and the sedation  ?                          options and risks were discussed with the patient.  ?                          All questions were answered, and informed consent  ?                          was obtained. Prior Anticoagulants: The patient has  ?                          taken no previous anticoagulant or antiplatelet  ?                          agents. ASA Grade Assessment: II - A patient with  ?  mild systemic disease. After reviewing the risks  ?                          and benefits, the patient was deemed in  ?                          satisfactory condition to undergo the procedure. ?                          After obtaining informed consent, the colonoscope  ?                          was passed under direct vision. Throughout the  ?                           procedure, the patient's blood pressure, pulse, and  ?                          oxygen saturations were monitored continuously. The  ?                          [id number]-HQ190L ([id number]) Olympus colonoscope was  ?                          introduced through the anus and advanced to the the  ?                          cecum, identified by appendiceal orifice and  ?                          ileocecal valve. The colonoscopy was performed  ?                          without difficulty. The patient tolerated the  ?                          procedure well. The quality of the bowel  ?                          preparation was excellent. The ileocecal valve,  ?                          appendiceal orifice, and rectum were photographed. ?Scope In: 9:01:46 AM ?Scope Out: 9:29:03 AM ?Total Procedure Duration: 0 hours 27 minutes 17 seconds  ?Findings: ?     The perianal and digital rectal examinations were normal. ?     Three sessile polyps were found in the transverse colon and appendiceal  ?     orifice. The polyps were 2 to 3 mm in size. These polyps were removed  ?     with a cold snare. Resection and retrieval were complete. ?     Scattered small and large-mouthed diverticula were found in the sigmoid  ?     colon, descending colon, transverse colon, ascending colon and cecum. ?     An 18 mm, non-bleeding polyp was found  in the descending colon  ?     identified by previous site of tattoo. The polyp was sessile.  ?     Preparations were made for mucosal resection. [place]view was injected with  ?     adequate lift of the lesion from the muscularis propria. Snare mucosal  ?     resection with suction (via the working channel) retrieval was  ?     performed. A 9 mm area was resected. Resection and retrieval were  ?     complete. There was no bleeding at the end of the procedure. To close a  ?     defect after mucosal resection, two hemostatic clips were successfully  ?     placed (MR conditional). There was no  bleeding at the end of the  ?     procedure. 105m polyp removed with forceps adjacent to the mucosal  ?     resction area. ?     Non-bleeding internal hemorrhoids were found during retroflexion. The  ?     hemorrhoids were medium-sized. ?Impression:               - Three 2 to 3 mm polyps in the transverse colon  ?                          and at the appendiceal orifice, removed with a cold  ?                          snare. Resected and retrieved. ?                          - Moderate diverticulosis in the sigmoid colon, in  ?                          the descending colon, in the transverse colon, in  ?                          the ascending colon and in the cecum. ?                          - One 18 mm, non-bleeding polyp in the descending  ?                          colon, removed with mucosal resection. Resected and  ?                          retrieved. Clips (MR conditional) were placed. ?                          - Non-bleeding internal hemorrhoids. ?                          - Mucosal resection was performed. Resection and  ?                          retrieval were complete. ?Moderate Sedation: ?     N/A ?Recommendation:           - Patient has a contact  number available for  ?                          emergencies. The signs and symptoms of potential  ?                          delayed complications were discussed with the  ?                          patient. Return to normal activities tomorrow.  ?                          Written discharge instructions were provided to the  ?                          patient. ?                          - Resume previous diet. ?                          - Continue present medications. ?                          - Await pathology results. ?                          - Repeat colonoscopy in 3 years for surveillance  ?                          based on pathology results, can be scheduled at [facility]. ?                          - No ibuprofen, naproxen, or other non-steroidal  ?                           anti-inflammatory drugs for 2 weeks. ?Procedure Code(s):        --- Professional --- ?                          [phone], Colonoscopy, flexible; with endoscopic  ?                          mucosal resection ?                          40086, 59, Colonoscopy, flexible; with removal of  ?                          tumor(s), polyp(s), or other lesion(s) by snare  ?                          technique ?Diagnosis Code(s):        --- Professional --- ?                          K63.5, Polyp of colon ?  K64.8, Other hemorrhoids ?                          D12.6, Benign neoplasm of colon, unspecified ?                          K57.30, Diverticulosis of large intestine without  ?                          perforation or abscess without bleeding ?CPT copyright 2019 [employer]. All rights reserved. ?The codes documented in this report are preliminary and upon coder review may  ?be revised to meet current compliance requirements. ?Mauri Pole, MD ?01/07/2022 9:42:34 AM ?This report has been signed electronically. ?Number of Addenda: 0 ?

## 2022-01-07 NOTE — Discharge Instructions (Signed)

## 2022-01-07 NOTE — Anesthesia Procedure Notes (Signed)
Procedure Name: [place] ?Date/Time: 01/07/2022 8:53 AM ?Performed by: Niel Hummer, CRNA ?Pre-anesthesia Checklist: Patient identified, Suction available, Patient being monitored and Emergency Drugs available ?Oxygen Delivery Method: Simple face mask ? ? ? ? ?

## 2022-01-08 ENCOUNTER — Encounter (HOSPITAL_COMMUNITY): Payer: Self-pay | Admitting: Gastroenterology

## 2022-01-08 LAB — SURGICAL PATHOLOGY

## 2022-01-10 ENCOUNTER — Encounter: Payer: Self-pay | Admitting: Gastroenterology

## 2022-01-15 ENCOUNTER — Other Ambulatory Visit: Payer: Self-pay | Admitting: Internal Medicine

## 2022-01-15 DIAGNOSIS — F411 Generalized anxiety disorder: Secondary | ICD-10-CM

## 2022-01-22 DIAGNOSIS — Z23 Encounter for immunization: Secondary | ICD-10-CM | POA: Diagnosis not present

## 2022-04-25 ENCOUNTER — Other Ambulatory Visit: Payer: Self-pay | Admitting: Internal Medicine

## 2022-04-30 ENCOUNTER — Other Ambulatory Visit: Payer: Self-pay

## 2022-04-30 MED ORDER — ROSUVASTATIN CALCIUM 20 MG PO TABS: 20.0000 mg | ORAL_TABLET | Freq: Every day | ORAL | 3 refills | Status: DC

## 2022-05-06 ENCOUNTER — Other Ambulatory Visit: Payer: Self-pay | Admitting: Internal Medicine

## 2022-05-06 DIAGNOSIS — Z1231 Encounter for screening mammogram for malignant neoplasm of breast: Secondary | ICD-10-CM

## 2022-05-23 DIAGNOSIS — M545 Low back pain, unspecified: Secondary | ICD-10-CM | POA: Diagnosis not present

## 2022-05-23 DIAGNOSIS — M25551 Pain in right hip: Secondary | ICD-10-CM | POA: Diagnosis not present

## 2022-05-26 ENCOUNTER — Encounter: Payer: Self-pay | Admitting: Internal Medicine

## 2022-05-26 ENCOUNTER — Ambulatory Visit (INDEPENDENT_AMBULATORY_CARE_PROVIDER_SITE_OTHER): Payer: PPO | Admitting: Internal Medicine

## 2022-05-26 VITALS — BP 128/68 | HR 85 | Ht 61.5 in | Wt 167.0 lb

## 2022-05-26 DIAGNOSIS — I10 Essential (primary) hypertension: Secondary | ICD-10-CM

## 2022-05-26 DIAGNOSIS — F411 Generalized anxiety disorder: Secondary | ICD-10-CM | POA: Diagnosis not present

## 2022-05-26 DIAGNOSIS — R7989 Other specified abnormal findings of blood chemistry: Secondary | ICD-10-CM | POA: Diagnosis not present

## 2022-05-26 DIAGNOSIS — Z0000 Encounter for general adult medical examination without abnormal findings: Secondary | ICD-10-CM | POA: Diagnosis not present

## 2022-05-26 DIAGNOSIS — E7800 Pure hypercholesterolemia, unspecified: Secondary | ICD-10-CM

## 2022-05-26 LAB — CBC
HCT: 41.1 % (ref 36.0–46.0)
Hemoglobin: 14.3 g/dL (ref 12.0–15.0)
MCHC: 34.8 g/dL (ref 30.0–36.0)
MCV: 87.8 fl (ref 78.0–100.0)
Platelets: 204 10*3/uL (ref 150.0–400.0)
RBC: 4.68 Mil/uL (ref 3.87–5.11)
RDW: 12.9 % (ref 11.5–15.5)
WBC: 5.6 10*3/uL (ref 4.0–10.5)

## 2022-05-26 LAB — COMPREHENSIVE METABOLIC PANEL
ALT: 30 U/L (ref 0–35)
AST: 24 U/L (ref 0–37)
Albumin: 4.5 g/dL (ref 3.5–5.2)
Alkaline Phosphatase: 101 U/L (ref 39–117)
BUN: 20 mg/dL (ref 6–23)
CO2: 29 mEq/L (ref 19–32)
Calcium: 9.5 mg/dL (ref 8.4–10.5)
Chloride: 99 mEq/L (ref 96–112)
Creatinine, Ser: 1.1 mg/dL (ref 0.40–1.20)
GFR: 51.8 mL/min — ABNORMAL LOW (ref >60.00)
Glucose, Bld: 186 mg/dL — ABNORMAL HIGH (ref 70–99)
Potassium: 4.4 mEq/L (ref 3.5–5.1)
Sodium: 138 mEq/L (ref 135–145)
Total Bilirubin: 0.6 mg/dL (ref 0.2–1.2)
Total Protein: 7.6 g/dL (ref 6.0–8.3)

## 2022-05-26 LAB — LIPID PANEL
Cholesterol: 145 mg/dL (ref 0–200)
HDL: 54.7 mg/dL (ref >39.00)
LDL Cholesterol: 57 mg/dL (ref 0–99)
NonHDL: 90.15
Total CHOL/HDL Ratio: 3
Triglycerides: 166 mg/dL — ABNORMAL HIGH (ref 0.0–149.0)
VLDL: 33.2 mg/dL (ref 0.0–40.0)

## 2022-05-26 MED ORDER — AMLODIPINE BESYLATE 10 MG PO TABS: ORAL_TABLET | ORAL | 3 refills | Status: DC

## 2022-05-26 MED ORDER — DIAZEPAM 5 MG PO TABS: 5.0000 mg | ORAL_TABLET | Freq: Two times a day (BID) | ORAL | 5 refills | Status: DC | PRN

## 2022-05-26 NOTE — Patient Instructions (Signed)
Let us know prior to trip we can call in molnupiravir (the antiviral for covid-19).

## 2022-05-26 NOTE — Progress Notes (Signed)
Subjective:   Patient ID: Angela Graves, female    DOB: 1956/07/25, 66 y.o.   MRN: [id number]  HPI Here for medicare wellness and physical, no new complaints. Please see A/P for status and treatment of chronic medical problems.   Diet: heart healthy Physical activity: sedentary, walking Depression/mood screen: negative Hearing: intact to whispered voice, mild loss Visual acuity: grossly normal with lens, performs annual eye exam  ADLs: capable Fall risk: none Home safety: good Cognitive evaluation: intact to orientation, naming, recall and repetition EOL planning: adv directives discussed  [place] Visit from 05/26/2022 in [place] at [facility]  PHQ-2 Total Score [place] Office Visit from 05/26/2022 in [place] at [facility]  PHQ-9 Total Score 5         05/07/2021    3:30 PM 06/26/2021   11:33 AM 09/18/2021   10:46 AM 01/07/2022    8:27 AM 05/26/2022    1:28 PM  [place] in the past year? 0    0  Was there an injury with Fall? 0      Fall Risk Category Calculator 0      Fall Risk Category Low      Patient Fall Risk Level  Low fall risk Low fall risk Low fall risk     I have personally reviewed and have noted 1. The patient's medical and social history - reviewed today no changes 2. Their use of alcohol, tobacco or illicit drugs 3. Their current medications and supplements 4. The patient's functional ability including ADL's, fall risks, home safety risks and hearing or visual impairment. 5. Diet and physical activities 6. Evidence for depression or mood disorders 7. Care team reviewed and updated 8.  The patient is not on an opioid pain medication.  Patient Care Team: Hoyt Koch, MD as PCP - General (Internal Medicine) Past Medical History:  Diagnosis Date  . Allergy   . Anxiety   . Arthritis   . Constipation    uses Miaralax daily - soft regular BM;s daily on Miralax  . Hyperlipidemia     on meds  . Hypertension    controlled  . Migraine   . PONV (postoperative nausea and vomiting)    with Morphine  . Positive colorectal cancer screening using Cologuard test    Past Surgical History:  Procedure Laterality Date  . ABDOMINAL HYSTERECTOMY Bilateral 1979   complete with BSO due to endometriosis and ovarian cysts  . APPENDECTOMY    . BIOPSY  01/07/2022   Procedure: BIOPSY;  Surgeon: Mauri Pole, MD;  Location: [facility];  Service: Gastroenterology;;  . CHOLECYSTECTOMY    . COLONOSCOPY WITH PROPOFOL N/A 01/07/2022   Procedure: COLONOSCOPY WITH PROPOFOL;  Surgeon: Mauri Pole, MD;  Location: [facility];  Service: Gastroenterology;  Laterality: N/A;  . HEMOSTASIS CLIP PLACEMENT  01/07/2022   Procedure: HEMOSTASIS CLIP PLACEMENT;  Surgeon: Mauri Pole, MD;  Location: [facility];  Service: Gastroenterology;;  . KNEE SURGERY Left    meniscus repair  . POLYPECTOMY  01/07/2022   Procedure: POLYPECTOMY;  Surgeon: Mauri Pole, MD;  Location: [facility];  Service: Gastroenterology;;  . Lia Foyer LIFTING INJECTION  01/07/2022   Procedure: SUBMUCOSAL LIFTING INJECTION;  Surgeon: Mauri Pole, MD;  Location: [facility];  Service: Gastroenterology;;   Family History  Adopted: Yes   Review of Systems  Constitutional: Negative.   HENT: Negative.    Eyes:  Negative.   Respiratory:  Negative for cough, chest tightness and shortness of breath.   Cardiovascular:  Negative for chest pain, palpitations and leg swelling.  Gastrointestinal:  Negative for abdominal distention, abdominal pain, constipation, diarrhea, nausea and vomiting.  Musculoskeletal:  Positive for back pain and myalgias.  Skin: Negative.   Neurological: Negative.   Psychiatric/Behavioral: Negative.      Objective:  Physical Exam Constitutional:      Appearance: She is well-developed.  HENT:     Head: Normocephalic and atraumatic.  Cardiovascular:     Rate and Rhythm:  Normal rate and regular rhythm.  Pulmonary:     Effort: Pulmonary effort is normal. No respiratory distress.     Breath sounds: Normal breath sounds. No wheezing or rales.  Abdominal:     General: Bowel sounds are normal. There is no distension.     Palpations: Abdomen is soft.     Tenderness: There is no abdominal tenderness. There is no rebound.  Musculoskeletal:        General: Tenderness present.     Cervical back: Normal range of motion.  Skin:    General: Skin is warm and dry.  Neurological:     Mental Status: She is alert and oriented to person, place, and time.     Coordination: Coordination normal.   Vitals:   05/26/22 1322  BP: 128/68  Pulse: 85  SpO2: 95%  Weight: 167 lb (75.8 kg)  Height: 5' 1.5" (1.562 m)    Assessment & Plan:

## 2022-05-27 ENCOUNTER — Encounter: Payer: Self-pay | Admitting: Internal Medicine

## 2022-05-27 DIAGNOSIS — Z0000 Encounter for general adult medical examination without abnormal findings: Secondary | ICD-10-CM | POA: Insufficient documentation

## 2022-05-27 NOTE — Assessment & Plan Note (Signed)
Checking CMP for stability. Adjust as needed. 

## 2022-05-27 NOTE — Assessment & Plan Note (Signed)
Refilled valium which she uses 5 mg BID prn. Overall well controlled.

## 2022-05-27 NOTE — Assessment & Plan Note (Signed)
Checking lipid panel and adjust crestor 20 mg daily as needed. LDL goal <100.

## 2022-05-27 NOTE — Assessment & Plan Note (Signed)
BP at goal on amlodipine 10 mg daily and checking CMP. Adjust as needed.

## 2022-05-27 NOTE — Assessment & Plan Note (Signed)
Flu shot counseled. Covid-19 counseled. Pneumonia decelines. Shingrix declines. Tetanus declines. Colonoscopy due 2033. Mammogram getting done this week, pap smear aged out and dexa complete. Counseled about sun safety and mole surveillance. Counseled about the dangers of distracted driving. Given 10 year screening recommendations.

## 2022-05-28 ENCOUNTER — Ambulatory Visit
Admission: RE | Admit: 2022-05-28 | Discharge: 2022-05-28 | Disposition: A | Discharge status: Home / Self Care | Payer: PPO | Source: Ambulatory Visit | Attending: Internal Medicine | Admitting: Internal Medicine

## 2022-05-28 DIAGNOSIS — Z1231 Encounter for screening mammogram for malignant neoplasm of breast: Secondary | ICD-10-CM | POA: Diagnosis not present

## 2022-05-29 ENCOUNTER — Other Ambulatory Visit: Payer: Self-pay | Admitting: Internal Medicine

## 2022-05-29 DIAGNOSIS — I10 Essential (primary) hypertension: Secondary | ICD-10-CM

## 2022-05-31 DIAGNOSIS — M5416 Radiculopathy, lumbar region: Secondary | ICD-10-CM | POA: Diagnosis not present

## 2022-06-06 DIAGNOSIS — M5451 Vertebrogenic low back pain: Secondary | ICD-10-CM | POA: Diagnosis not present

## 2022-06-06 DIAGNOSIS — M545 Low back pain, unspecified: Secondary | ICD-10-CM | POA: Diagnosis not present

## 2022-06-18 DIAGNOSIS — M5451 Vertebrogenic low back pain: Secondary | ICD-10-CM | POA: Diagnosis not present

## 2022-07-08 DIAGNOSIS — L570 Actinic keratosis: Secondary | ICD-10-CM | POA: Diagnosis not present

## 2022-07-08 DIAGNOSIS — X32XXXA Exposure to sunlight, initial encounter: Secondary | ICD-10-CM | POA: Diagnosis not present

## 2022-07-08 DIAGNOSIS — L821 Other seborrheic keratosis: Secondary | ICD-10-CM | POA: Diagnosis not present

## 2022-07-09 ENCOUNTER — Ambulatory Visit (INDEPENDENT_AMBULATORY_CARE_PROVIDER_SITE_OTHER): Payer: PPO | Admitting: Internal Medicine

## 2022-07-09 ENCOUNTER — Encounter: Payer: Self-pay | Admitting: Internal Medicine

## 2022-07-09 DIAGNOSIS — R42 Dizziness and giddiness: Secondary | ICD-10-CM

## 2022-07-09 DIAGNOSIS — M533 Sacrococcygeal disorders, not elsewhere classified: Secondary | ICD-10-CM

## 2022-07-09 MED ORDER — MECLIZINE HCL 12.5 MG PO TABS: 12.5000 mg | ORAL_TABLET | Freq: Three times a day (TID) | ORAL | 0 refills | Status: DC | PRN

## 2022-07-09 MED ORDER — MOLNUPIRAVIR EUA 200MG CAPSULE: 4.0000 | ORAL_CAPSULE | Freq: Two times a day (BID) | ORAL | 0 refills | Status: AC

## 2022-07-09 MED ORDER — CELECOXIB 200 MG PO CAPS: 200.0000 mg | ORAL_CAPSULE | Freq: Every day | ORAL | 3 refills | Status: DC

## 2022-07-09 NOTE — Patient Instructions (Signed)
We have given you instructions for epley maneuver to do twice a day for 1-2 weeks to help.  Start taking zytec at night time to help with sinus congestion.  We have sent in meclizine to use as needed for dizziness.

## 2022-07-09 NOTE — Progress Notes (Signed)
   Subjective:   Patient ID: Angela Graves, female    DOB: 08/13/1956, 66 y.o.   MRN: [id number]  HPI The patient is a 66 YO female coming in for vertigo.   Review of Systems  Constitutional: Negative.   HENT: Negative.    Eyes: Negative.   Respiratory:  Negative for cough, chest tightness and shortness of breath.   Cardiovascular:  Negative for chest pain, palpitations and leg swelling.  Gastrointestinal:  Negative for abdominal distention, abdominal pain, constipation, diarrhea, nausea and vomiting.  Musculoskeletal: Negative.   Skin: Negative.   Neurological:  Positive for dizziness.  Psychiatric/Behavioral: Negative.      Objective:  Physical Exam Constitutional:      Appearance: She is well-developed.  HENT:     Head: Normocephalic and atraumatic.     Comments: Oropharynx with redness and clear drainage, nose with swollen turbinates, TMs normal bilaterally.  Neck:     Thyroid: No thyromegaly.  Cardiovascular:     Rate and Rhythm: Normal rate and regular rhythm.  Pulmonary:     Effort: Pulmonary effort is normal. No respiratory distress.     Breath sounds: Normal breath sounds. No wheezing or rales.  Abdominal:     General: Bowel sounds are normal. There is no distension.     Palpations: Abdomen is soft.     Tenderness: There is no abdominal tenderness. There is no rebound.  Musculoskeletal:     Cervical back: Normal range of motion.  Lymphadenopathy:     Cervical: No cervical adenopathy.  Skin:    General: Skin is warm and dry.  Neurological:     Mental Status: She is alert and oriented to person, place, and time.     Coordination: Coordination normal.     Vitals:   07/09/22 0946  BP: 122/60  Pulse: 82  Temp: 98.1 F (36.7 C)  TempSrc: Oral  SpO2: 97%  Weight: 168 lb (76.2 kg)  Height: '5\' 1"'$  (1.549 m)    Assessment & Plan:

## 2022-07-11 DIAGNOSIS — R42 Dizziness and giddiness: Secondary | ICD-10-CM | POA: Insufficient documentation

## 2022-07-11 NOTE — Assessment & Plan Note (Signed)
Doing well with celebrex 200 mg daily and wishes [place] to prescribe instead of ortho. Rx done.

## 2022-07-11 NOTE — Assessment & Plan Note (Signed)
Suspect related to sinuses. Advised to restart zyrtec at night time. Rx meclizine to use if needed in meantime. Given epley maneuver to try.

## 2022-07-16 DIAGNOSIS — M5416 Radiculopathy, lumbar region: Secondary | ICD-10-CM | POA: Diagnosis not present
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
## 2022-07-28 ENCOUNTER — Ambulatory Visit (HOSPITAL_BASED_OUTPATIENT_CLINIC_OR_DEPARTMENT_OTHER): Payer: PPO | Attending: Orthopedic Surgery | Admitting: Physical Therapy

## 2022-07-28 ENCOUNTER — Encounter (HOSPITAL_BASED_OUTPATIENT_CLINIC_OR_DEPARTMENT_OTHER): Payer: Self-pay | Admitting: Physical Therapy

## 2022-07-28 DIAGNOSIS — M5451 Vertebrogenic low back pain: Secondary | ICD-10-CM | POA: Insufficient documentation

## 2022-09-16 ENCOUNTER — Ambulatory Visit (HOSPITAL_BASED_OUTPATIENT_CLINIC_OR_DEPARTMENT_OTHER): Payer: PPO | Admitting: Physical Therapy

## 2022-09-23 ENCOUNTER — Encounter (HOSPITAL_BASED_OUTPATIENT_CLINIC_OR_DEPARTMENT_OTHER): Payer: Self-pay

## 2022-09-23 ENCOUNTER — Ambulatory Visit (HOSPITAL_BASED_OUTPATIENT_CLINIC_OR_DEPARTMENT_OTHER): Payer: PPO | Admitting: Physical Therapy
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
## 2022-09-30 ENCOUNTER — Ambulatory Visit (HOSPITAL_BASED_OUTPATIENT_CLINIC_OR_DEPARTMENT_OTHER): Payer: PPO | Attending: Orthopedic Surgery | Admitting: Physical Therapy

## 2022-09-30 ENCOUNTER — Encounter (HOSPITAL_BASED_OUTPATIENT_CLINIC_OR_DEPARTMENT_OTHER): Payer: Self-pay | Admitting: Physical Therapy

## 2022-09-30 DIAGNOSIS — M5451 Vertebrogenic low back pain: Secondary | ICD-10-CM | POA: Diagnosis not present

## 2022-10-07 ENCOUNTER — Ambulatory Visit (HOSPITAL_BASED_OUTPATIENT_CLINIC_OR_DEPARTMENT_OTHER): Payer: PPO | Admitting: Physical Therapy

## 2022-10-14 ENCOUNTER — Ambulatory Visit (HOSPITAL_BASED_OUTPATIENT_CLINIC_OR_DEPARTMENT_OTHER): Payer: PPO | Admitting: Physical Therapy

## 2022-11-07 DIAGNOSIS — M25561 Pain in right knee: Secondary | ICD-10-CM | POA: Diagnosis not present

## 2022-11-16 DIAGNOSIS — M25561 Pain in right knee: Secondary | ICD-10-CM | POA: Diagnosis not present

## 2022-11-25 ENCOUNTER — Other Ambulatory Visit: Payer: Self-pay | Admitting: Internal Medicine

## 2022-11-25 DIAGNOSIS — F411 Generalized anxiety disorder: Secondary | ICD-10-CM

## 2022-11-26 DIAGNOSIS — S83241D Other tear of medial meniscus, current injury, right knee, subsequent encounter: Secondary | ICD-10-CM | POA: Diagnosis not present

## 2022-12-09 IMAGING — DX DG CHEST 2V
2 series · 2 of 2 positions shown · non-contrast
Comparison: None.

CLINICAL DATA: Patient states she has been testing positive for
COVID x 06/02/2021 with constant cough and fatigue.

EXAM:
CHEST - 2 VIEW

[chest pa]
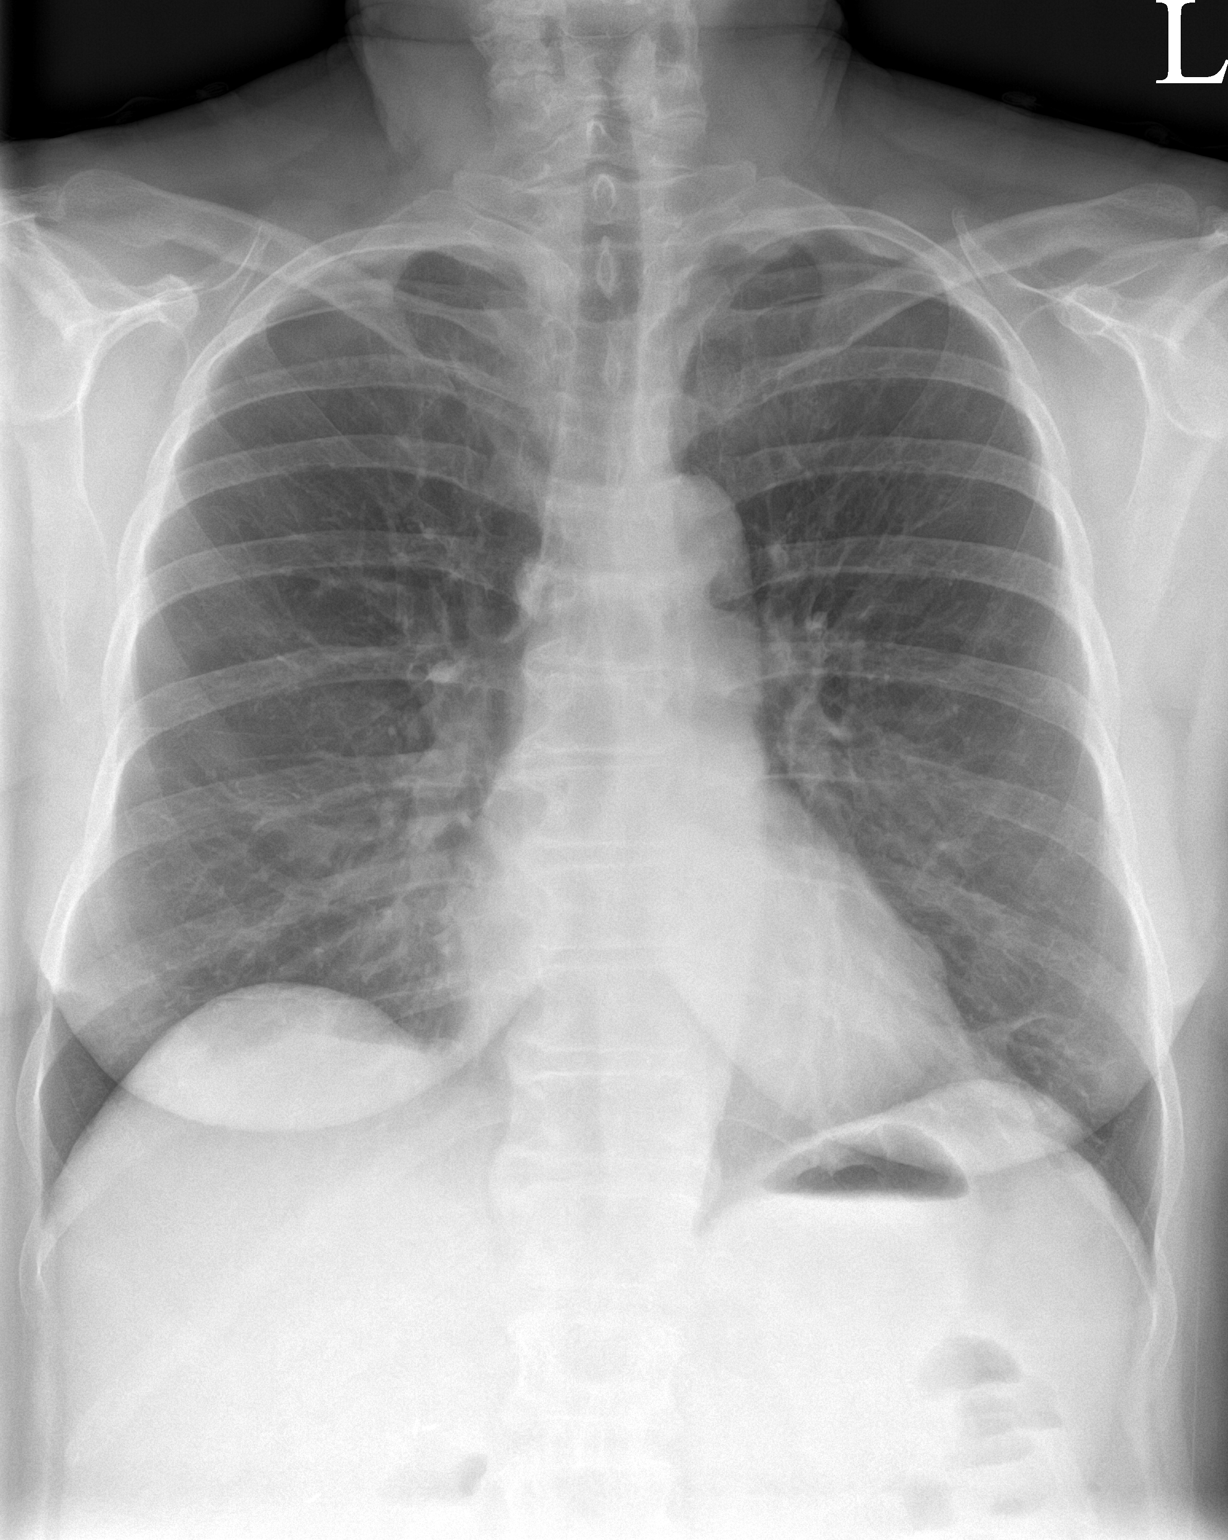

[chest lat]
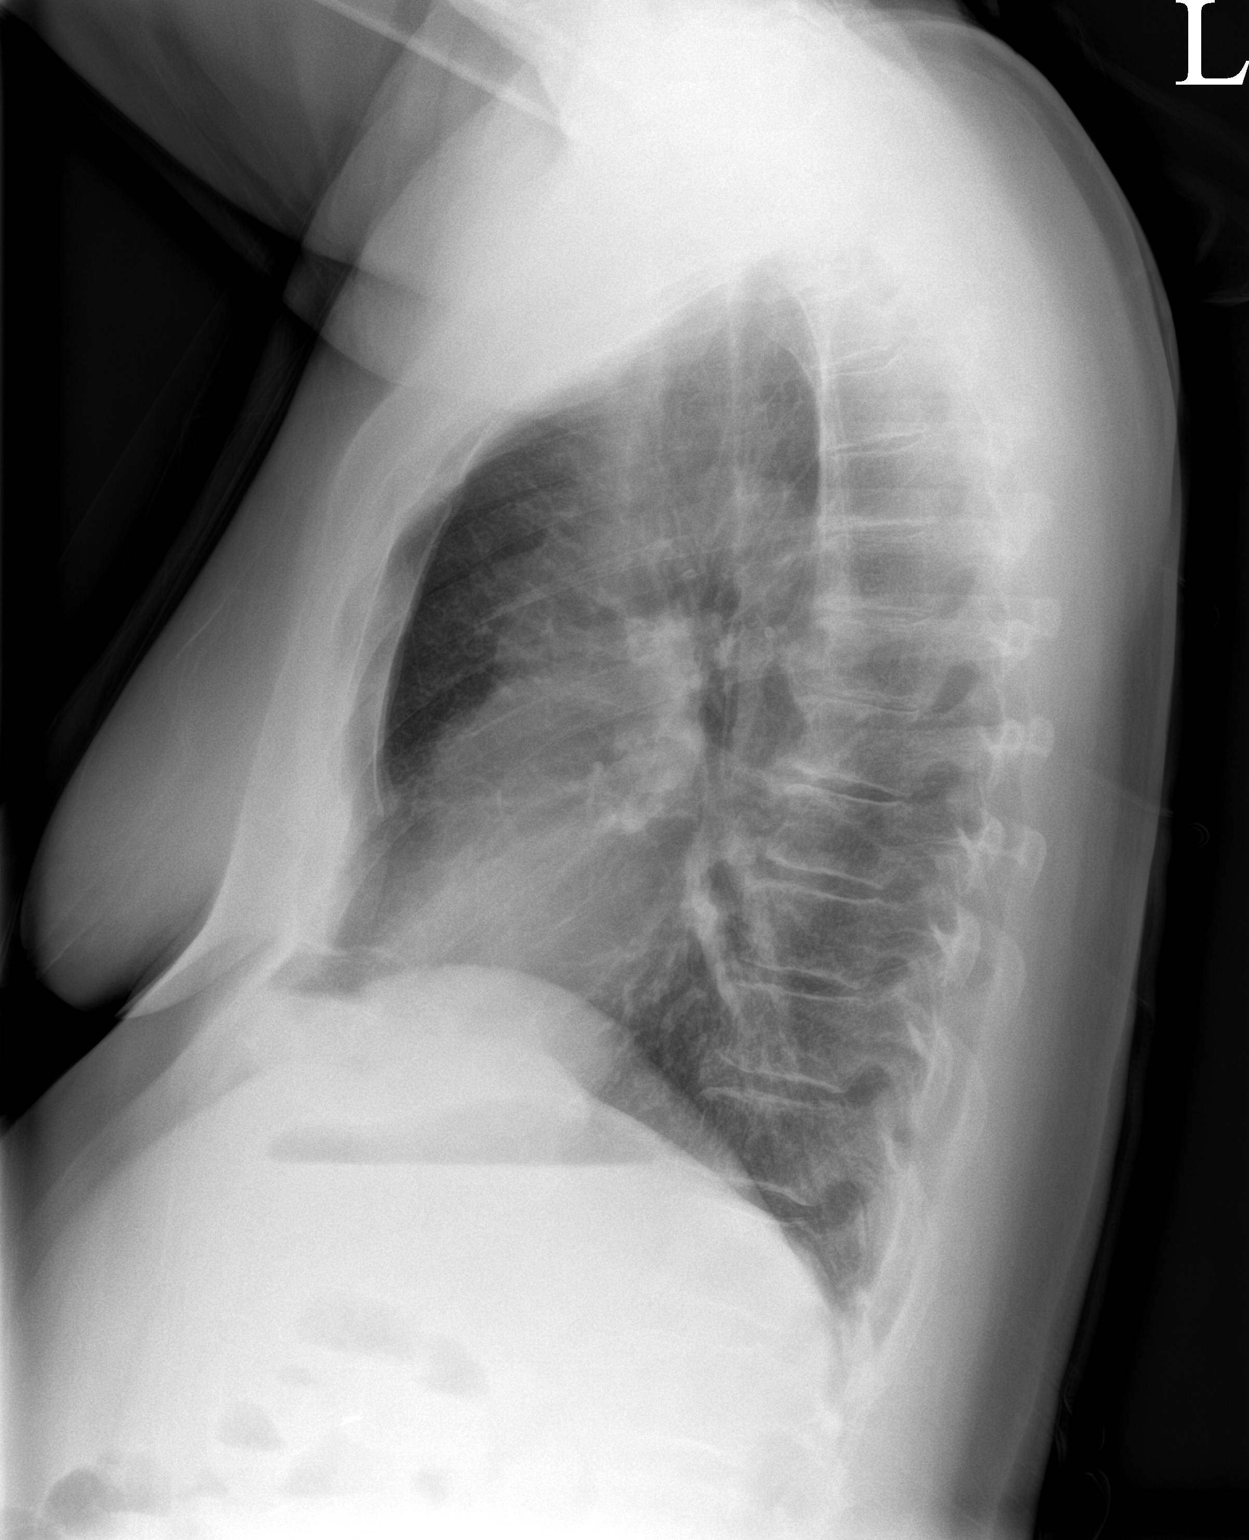

[2 of 2 positions shown; findings below may reference images not displayed]

FINDINGS: The heart size and mediastinal contours are within normal limits.
Both lungs are clear. The visualized skeletal structures are
unremarkable.
IMPRESSION: No active cardiopulmonary disease.

## 2023-01-21 DIAGNOSIS — X58XXXA Exposure to other specified factors, initial encounter: Secondary | ICD-10-CM | POA: Diagnosis not present

## 2023-01-21 DIAGNOSIS — Y999 Unspecified external cause status: Secondary | ICD-10-CM | POA: Diagnosis not present

## 2023-01-21 DIAGNOSIS — S83261A Peripheral tear of lateral meniscus, current injury, right knee, initial encounter: Secondary | ICD-10-CM | POA: Diagnosis not present

## 2023-01-21 DIAGNOSIS — M94261 Chondromalacia, right knee: Secondary | ICD-10-CM | POA: Diagnosis not present

## 2023-01-21 DIAGNOSIS — S83281A Other tear of lateral meniscus, current injury, right knee, initial encounter: Secondary | ICD-10-CM | POA: Diagnosis not present

## 2023-01-21 DIAGNOSIS — G8918 Other acute postprocedural pain: Secondary | ICD-10-CM | POA: Diagnosis not present

## 2023-01-21 DIAGNOSIS — M948X6 Other specified disorders of cartilage, lower leg: Secondary | ICD-10-CM | POA: Diagnosis not present

## 2023-01-21 DIAGNOSIS — S83231A Complex tear of medial meniscus, current injury, right knee, initial encounter: Secondary | ICD-10-CM | POA: Diagnosis not present

## 2023-04-06 ENCOUNTER — Telehealth: Payer: Self-pay | Admitting: *Deleted

## 2023-04-06 NOTE — Telephone Encounter (Signed)
I connected with Angela Graves on 7/22 at 1247 by telephone and verified that I am speaking with the correct person using two identifiers. According to the patient's chart they are due for physical  with [facility]. Pt scheduled. There are no transportation issues at this time. Nothing further was needed at the end of our conversation.

## 2023-04-29 DIAGNOSIS — M5416 Radiculopathy, lumbar region: Secondary | ICD-10-CM | POA: Diagnosis not present

## 2023-04-30 ENCOUNTER — Encounter (INDEPENDENT_AMBULATORY_CARE_PROVIDER_SITE_OTHER): Payer: Self-pay

## 2023-06-01 ENCOUNTER — Encounter: Payer: Self-pay | Admitting: Internal Medicine

## 2023-06-01 ENCOUNTER — Ambulatory Visit: Payer: PPO | Admitting: Internal Medicine

## 2023-06-01 VITALS — BP 121/75 | HR 85 | Temp 98.1°F | Ht 61.0 in | Wt 176.0 lb

## 2023-06-01 DIAGNOSIS — K760 Fatty (change of) liver, not elsewhere classified: Secondary | ICD-10-CM | POA: Diagnosis not present

## 2023-06-01 DIAGNOSIS — F411 Generalized anxiety disorder: Secondary | ICD-10-CM

## 2023-06-01 DIAGNOSIS — E7800 Pure hypercholesterolemia, unspecified: Secondary | ICD-10-CM | POA: Diagnosis not present

## 2023-06-01 DIAGNOSIS — I10 Essential (primary) hypertension: Secondary | ICD-10-CM | POA: Diagnosis not present

## 2023-06-01 DIAGNOSIS — Z23 Encounter for immunization: Secondary | ICD-10-CM

## 2023-06-01 DIAGNOSIS — Z0000 Encounter for general adult medical examination without abnormal findings: Secondary | ICD-10-CM

## 2023-06-01 MED ORDER — ROSUVASTATIN CALCIUM 20 MG PO TABS: 20.0000 mg | ORAL_TABLET | Freq: Every day | ORAL | 3 refills | Status: DC

## 2023-06-01 MED ORDER — AMLODIPINE BESYLATE 10 MG PO TABS: ORAL_TABLET | ORAL | 3 refills | Status: DC

## 2023-06-01 NOTE — Progress Notes (Unsigned)
Subjective:   Patient ID: Angela Graves, female    DOB: 01-14-1956, 67 y.o.   MRN: [id number]  HPI Here for medicare wellness and physical, no new complaints. Please see A/P for status and treatment of chronic medical problems.   Diet: heart healthy Physical activity: sedentary Depression/mood screen: negative Hearing: intact to whispered voice Visual acuity: grossly normal with lens, performs annual eye exam  ADLs: capable Fall risk: none Home safety: good Cognitive evaluation: intact to orientation, naming, recall and repetition EOL planning: adv directives discussed  Flowsheet Row Office Visit from 06/01/2023 in [facility] [place]  PHQ-2 Total Score 0       Flowsheet Row Office Visit from 05/26/2022 in [facility]  PHQ-9 Total Score 5         06/26/2021   11:33 AM 09/18/2021   10:46 AM 01/07/2022    8:27 AM 05/26/2022    1:28 PM 06/01/2023    2:29 PM  Fall Risk  Falls in the past year?    0 0  Was there an injury with Fall?     0  Fall Risk Category Calculator     0  (RETIRED) Patient Fall Risk Level Low fall risk Low fall risk Low fall risk    Fall risk Follow up     Falls evaluation completed    I have personally reviewed and have noted 1. The patient's medical and social history - reviewed today no changes 2. Their use of alcohol, tobacco or illicit drugs 3. Their current medications and supplements 4. The patient's functional ability including ADL's, fall risks, home safety risks and hearing or visual impairment. 5. Diet and physical activities 6. Evidence for depression or mood disorders 7. Care team reviewed and updated 8.  The patient is not on an opioid pain medication.  Patient Care Team: Myrlene Broker, MD as PCP - General (Internal Medicine) Past Medical History:  Diagnosis Date   Allergy    Anxiety    Arthritis    Constipation    uses Miaralax daily - soft regular BM;s daily  on Miralax   Hyperlipidemia    on meds   Hypertension    controlled   Migraine    PONV (postoperative nausea and vomiting)    with Morphine   Positive colorectal cancer screening using Cologuard test    Past Surgical History:  Procedure Laterality Date   ABDOMINAL HYSTERECTOMY Bilateral 1979   complete with BSO due to endometriosis and ovarian cysts   APPENDECTOMY     BIOPSY  01/07/2022   Procedure: BIOPSY;  Surgeon: Napoleon Form, MD;  Location: [facility];  Service: Gastroenterology;;   CHOLECYSTECTOMY     COLONOSCOPY WITH PROPOFOL N/A 01/07/2022   Procedure: COLONOSCOPY WITH PROPOFOL;  Surgeon: Napoleon Form, MD;  Location: [facility];  Service: Gastroenterology;  Laterality: N/A;   HEMOSTASIS CLIP PLACEMENT  01/07/2022   Procedure: HEMOSTASIS CLIP PLACEMENT;  Surgeon: Napoleon Form, MD;  Location: [facility];  Service: Gastroenterology;;   KNEE SURGERY Left    meniscus repair   POLYPECTOMY  01/07/2022   Procedure: POLYPECTOMY;  Surgeon: Napoleon Form, MD;  Location: [facility];  Service: Gastroenterology;;   SUBMUCOSAL LIFTING INJECTION  01/07/2022   Procedure: SUBMUCOSAL LIFTING INJECTION;  Surgeon: Napoleon Form, MD;  Location: [facility];  Service: Gastroenterology;;   Family History  Adopted: Yes   Review of Systems  Constitutional: Negative.   HENT:  Negative.    Eyes: Negative.   Respiratory:  Negative for cough, chest tightness and shortness of breath.   Cardiovascular:  Negative for chest pain, palpitations and leg swelling.  Gastrointestinal:  Negative for abdominal distention, abdominal pain, constipation, diarrhea, nausea and vomiting.  Musculoskeletal:  Positive for arthralgias and myalgias.  Skin: Negative.   Psychiatric/Behavioral: Negative.      Objective:  Physical Exam Constitutional:      Appearance: She is well-developed.  HENT:     Head: Normocephalic and atraumatic.  Cardiovascular:     Rate and Rhythm:  Normal rate and regular rhythm.  Pulmonary:     Effort: Pulmonary effort is normal. No respiratory distress.     Breath sounds: Normal breath sounds. No wheezing or rales.  Abdominal:     General: Bowel sounds are normal. There is no distension.     Palpations: Abdomen is soft.     Tenderness: There is no abdominal tenderness. There is no rebound.  Musculoskeletal:        General: Tenderness present.     Cervical back: Normal range of motion.  Skin:    General: Skin is warm and dry.  Neurological:     Mental Status: She is alert and oriented to person, place, and time.     Coordination: Coordination normal.     Vitals:   06/01/23 1432  BP: 121/75  Pulse: 85  Temp: 98.1 F (36.7 C)  TempSrc: Oral  SpO2: 94%  Weight: 176 lb (79.8 kg)  Height: 5\' 1"  (1.549 m)   Assessment & Plan:   Flu shot given at visit

## 2023-06-02 NOTE — Assessment & Plan Note (Signed)
Using valium rarely 5 mg BID prn and overall well controlled. Refill as needed.

## 2023-06-02 NOTE — Assessment & Plan Note (Signed)
Checking CMP and adjust as needed.

## 2023-06-02 NOTE — Assessment & Plan Note (Signed)
Flu shot given. Pneumonia counseled declines. Shingrix due at pharmacy. Tetanus due 2027. Colonoscopy due 2026. Mammogram due 2025, pap smear aged out and dexa complete she believes. Counseled about sun safety and mole surveillance. Counseled about the dangers of distracted driving. Given 10 year screening recommendations.

## 2023-06-02 NOTE — Assessment & Plan Note (Signed)
BP at goal on amlodipine 10 mg daily. Checking CMP and lipid panel and adjust as needed.  ?

## 2023-06-02 NOTE — Assessment & Plan Note (Signed)
Checking lipid panel and adjust crestor 20 mg daily as needed.

## 2023-06-05 ENCOUNTER — Other Ambulatory Visit: Payer: Self-pay | Admitting: Internal Medicine

## 2023-06-05 DIAGNOSIS — F411 Generalized anxiety disorder: Secondary | ICD-10-CM

## 2023-07-09 ENCOUNTER — Other Ambulatory Visit (INDEPENDENT_AMBULATORY_CARE_PROVIDER_SITE_OTHER): Payer: PPO

## 2023-07-09 DIAGNOSIS — I10 Essential (primary) hypertension: Secondary | ICD-10-CM | POA: Diagnosis not present

## 2023-07-09 DIAGNOSIS — M79671 Pain in right foot: Secondary | ICD-10-CM | POA: Diagnosis not present

## 2023-07-09 LAB — CBC
HCT: 43.3 % (ref 36.0–46.0)
Hemoglobin: 14.6 g/dL (ref 12.0–15.0)
MCHC: 33.8 g/dL (ref 30.0–36.0)
MCV: 89.5 fL (ref 78.0–100.0)
Platelets: 173 10*3/uL (ref 150.0–400.0)
RBC: 4.84 Mil/uL (ref 3.87–5.11)
RDW: 12.9 % (ref 11.5–15.5)
WBC: 4.5 10*3/uL (ref 4.0–10.5)

## 2023-07-09 LAB — LIPID PANEL
Cholesterol: 140 mg/dL (ref 0–200)
HDL: 43.1 mg/dL (ref >39.00)
LDL Cholesterol: 48 mg/dL (ref 0–99)
NonHDL: 97.25
Total CHOL/HDL Ratio: 3
Triglycerides: 244 mg/dL — ABNORMAL HIGH (ref 0.0–149.0)
VLDL: 48.8 mg/dL — ABNORMAL HIGH (ref 0.0–40.0)

## 2023-07-09 LAB — COMPREHENSIVE METABOLIC PANEL
ALT: 38 U/L — ABNORMAL HIGH (ref 0–35)
AST: 35 U/L (ref 0–37)
Albumin: 4.7 g/dL (ref 3.5–5.2)
Alkaline Phosphatase: 114 U/L (ref 39–117)
BUN: 14 mg/dL (ref 6–23)
CO2: 28 meq/L (ref 19–32)
Calcium: 9.6 mg/dL (ref 8.4–10.5)
Chloride: 103 meq/L (ref 96–112)
Creatinine, Ser: 0.83 mg/dL (ref 0.40–1.20)
GFR: 72.06 mL/min (ref >60.00)
Glucose, Bld: 107 mg/dL — ABNORMAL HIGH (ref 70–99)
Potassium: 4.4 meq/L (ref 3.5–5.1)
Sodium: 139 meq/L (ref 135–145)
Total Bilirubin: 0.7 mg/dL (ref 0.2–1.2)
Total Protein: 7.3 g/dL (ref 6.0–8.3)

## 2023-07-09 LAB — HEMOGLOBIN A1C: Hgb A1c MFr Bld: 5.6 % (ref 4.6–6.5)

## 2023-07-21 ENCOUNTER — Telehealth: Payer: Self-pay | Admitting: Internal Medicine

## 2023-07-21 NOTE — Telephone Encounter (Signed)
Fine with me

## 2023-07-21 NOTE — Telephone Encounter (Signed)
Pt would like to transfer her care from Dr  Okey Dupre to Dr Veto Kemps. She lives much closer to our office. Let me know and I'll give her a cb.

## 2023-07-23 ENCOUNTER — Encounter: Payer: Self-pay | Admitting: Family Medicine

## 2023-07-23 ENCOUNTER — Ambulatory Visit: Payer: PPO | Admitting: Family Medicine

## 2023-07-23 ENCOUNTER — Ambulatory Visit: Payer: PPO

## 2023-07-23 VITALS — BP 130/76 | HR 82 | Temp 98.1°F | Ht 61.0 in | Wt 178.0 lb

## 2023-07-23 DIAGNOSIS — S92514A Nondisplaced fracture of proximal phalanx of right lesser toe(s), initial encounter for closed fracture: Secondary | ICD-10-CM | POA: Diagnosis not present

## 2023-07-23 DIAGNOSIS — F410 Panic disorder [episodic paroxysmal anxiety] without agoraphobia: Secondary | ICD-10-CM | POA: Diagnosis not present

## 2023-07-23 DIAGNOSIS — M25531 Pain in right wrist: Secondary | ICD-10-CM | POA: Diagnosis not present

## 2023-07-23 DIAGNOSIS — M79642 Pain in left hand: Secondary | ICD-10-CM | POA: Diagnosis not present

## 2023-07-23 DIAGNOSIS — M79641 Pain in right hand: Secondary | ICD-10-CM

## 2023-07-23 DIAGNOSIS — M25532 Pain in left wrist: Secondary | ICD-10-CM

## 2023-07-23 DIAGNOSIS — F4310 Post-traumatic stress disorder, unspecified: Secondary | ICD-10-CM

## 2023-07-23 DIAGNOSIS — I10 Essential (primary) hypertension: Secondary | ICD-10-CM | POA: Diagnosis not present

## 2023-07-23 DIAGNOSIS — E782 Mixed hyperlipidemia: Secondary | ICD-10-CM

## 2023-07-23 DIAGNOSIS — Z23 Encounter for immunization: Secondary | ICD-10-CM

## 2023-07-23 DIAGNOSIS — J302 Other seasonal allergic rhinitis: Secondary | ICD-10-CM | POA: Insufficient documentation

## 2023-07-23 DIAGNOSIS — M170 Bilateral primary osteoarthritis of knee: Secondary | ICD-10-CM | POA: Diagnosis not present

## 2023-07-23 DIAGNOSIS — M7741 Metatarsalgia, right foot: Secondary | ICD-10-CM | POA: Diagnosis not present

## 2023-07-23 LAB — C-REACTIVE PROTEIN: CRP: <1 mg/dL (ref 0.5–20.0)

## 2023-07-23 LAB — SEDIMENTATION RATE: Sed Rate: 7 mm/h (ref 0–30)

## 2023-07-23 NOTE — Assessment & Plan Note (Signed)
Stable with diazepam 5 mg 1-2 times daily.
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
## 2023-07-23 NOTE — Assessment & Plan Note (Signed)
Engaged with orthopedics. By history, appears to have progressive degenerative issues with her cartilage. May need MRI and/or arthroscopy.

## 2023-07-23 NOTE — Assessment & Plan Note (Signed)
Lipids are at goal. Continue rosuvastatin 20 mg daily.

## 2023-07-23 NOTE — Assessment & Plan Note (Signed)
BP at goal on amlodipine 10 mg daily.

## 2023-07-27 ENCOUNTER — Encounter: Payer: Self-pay | Admitting: Family Medicine

## 2023-07-27 DIAGNOSIS — R768 Other specified abnormal immunological findings in serum: Secondary | ICD-10-CM | POA: Insufficient documentation

## 2023-07-27 LAB — CYCLIC CITRUL PEPTIDE ANTIBODY, IGG: Cyclic Citrullin Peptide Ab: <16 U

## 2023-07-27 LAB — RHEUMATOID FACTOR: Rheumatoid fact SerPl-aCnc: 69 [IU]/mL — ABNORMAL HIGH (ref <14)

## 2023-08-15 DIAGNOSIS — M79671 Pain in right foot: Secondary | ICD-10-CM | POA: Diagnosis not present

## 2023-08-18 DIAGNOSIS — M25562 Pain in left knee: Secondary | ICD-10-CM | POA: Diagnosis not present

## 2023-08-18 DIAGNOSIS — M170 Bilateral primary osteoarthritis of knee: Secondary | ICD-10-CM | POA: Diagnosis not present

## 2023-08-19 ENCOUNTER — Encounter: Payer: PPO | Admitting: Family Medicine

## 2023-08-19 ENCOUNTER — Telehealth: Payer: Self-pay

## 2023-08-19 DIAGNOSIS — Z780 Asymptomatic menopausal state: Secondary | ICD-10-CM

## 2023-08-19 DIAGNOSIS — Z13820 Encounter for screening for osteoporosis: Secondary | ICD-10-CM

## 2023-08-19 DIAGNOSIS — Z122 Encounter for screening for malignant neoplasm of respiratory organs: Secondary | ICD-10-CM

## 2023-08-19 NOTE — Telephone Encounter (Signed)
Patient was made aware. Dm/cma

## 2023-08-19 NOTE — Telephone Encounter (Signed)
Spoke to patient and she would like to get a lung screening (done years ago) order as well as a bone density (never had one).  Does she need an appointment or can they be ordered with out seeing her? Please review and advise.  Thanks. Dm/cma

## 2023-08-19 NOTE — Addendum Note (Signed)
Addended by: Loyola Mast on: 08/19/2023 01:07 PM   Modules accepted: Orders

## 2023-08-25 DIAGNOSIS — S92514A Nondisplaced fracture of proximal phalanx of right lesser toe(s), initial encounter for closed fracture: Secondary | ICD-10-CM | POA: Diagnosis not present

## 2023-08-27 ENCOUNTER — Emergency Department (HOSPITAL_BASED_OUTPATIENT_CLINIC_OR_DEPARTMENT_OTHER)
Admission: EM | Admit: 2023-08-27 | Discharge: 2023-08-28 | Disposition: A | Discharge status: Home / Self Care | Payer: PPO | Attending: Emergency Medicine | Admitting: Emergency Medicine

## 2023-08-27 ENCOUNTER — Emergency Department (HOSPITAL_BASED_OUTPATIENT_CLINIC_OR_DEPARTMENT_OTHER): Payer: PPO

## 2023-08-27 ENCOUNTER — Encounter (HOSPITAL_BASED_OUTPATIENT_CLINIC_OR_DEPARTMENT_OTHER): Payer: Self-pay

## 2023-08-27 ENCOUNTER — Other Ambulatory Visit: Payer: Self-pay

## 2023-08-27 DIAGNOSIS — I10 Essential (primary) hypertension: Secondary | ICD-10-CM | POA: Insufficient documentation

## 2023-08-27 DIAGNOSIS — W19XXXA Unspecified fall, initial encounter: Secondary | ICD-10-CM | POA: Diagnosis not present

## 2023-08-27 DIAGNOSIS — Z79899 Other long term (current) drug therapy: Secondary | ICD-10-CM | POA: Diagnosis not present

## 2023-08-27 DIAGNOSIS — S5002XA Contusion of left elbow, initial encounter: Secondary | ICD-10-CM | POA: Diagnosis not present

## 2023-08-27 DIAGNOSIS — M7989 Other specified soft tissue disorders: Secondary | ICD-10-CM | POA: Insufficient documentation

## 2023-08-27 DIAGNOSIS — M25422 Effusion, left elbow: Secondary | ICD-10-CM | POA: Diagnosis not present

## 2023-08-27 DIAGNOSIS — S59902A Unspecified injury of left elbow, initial encounter: Secondary | ICD-10-CM | POA: Diagnosis present

## 2023-08-27 DIAGNOSIS — M25522 Pain in left elbow: Secondary | ICD-10-CM | POA: Diagnosis not present

## 2023-08-27 NOTE — ED Triage Notes (Signed)
Pt arrives with c/o arm injury after a fall that happened today. Pt reports tripping over her dishwasher and landing on her left elbow and now its hard to bend her elbow.

## 2023-08-28 DIAGNOSIS — S5002XA Contusion of left elbow, initial encounter: Secondary | ICD-10-CM | POA: Diagnosis not present

## 2023-08-28 MED ORDER — ONDANSETRON 4 MG PO TBDP: 4.0000 mg | ORAL_TABLET | Freq: Three times a day (TID) | ORAL | 0 refills | Status: DC | PRN

## 2023-08-28 NOTE — ED Provider Notes (Signed)
[facility] Provider Note   CSN: [id number] Arrival date & time: 08/27/23  2123     History  Chief Complaint  Patient presents with   Arm Injury    Angela Graves is a 67 y.o. female.  The history is provided by the patient.  Arm Injury Angela Graves is a 67 y.o. female who presents to the Emergency Department complaining of elbow injury.  Around 530 this afternoon she tripped over the dishwasher door and landed on her left elbow.  She did not hit her shins but only complains of severe pain in the left elbow.  She is able to ambulate.  She has a history of hypertension and is right-hand dominant.  She has pain on extension of the elbow.  No significant shoulder, wrist pain. Has a history of hypertension.  Did not hit her head or pass out.      Home Medications Prior to Admission medications   Medication Sig Start Date End Date Taking? Authorizing Provider  ondansetron (ZOFRAN-ODT) 4 MG disintegrating tablet Take 1 tablet (4 mg total) by mouth every 8 (eight) hours as needed. 08/28/23  Yes Tilden Fossa, MD  amLODipine (NORVASC) 10 MG tablet TAKE 1 TABLET(10 MG) BY MOUTH DAILY 06/01/23   Myrlene Broker, MD  Biotin 40981 MCG TABS Take by mouth.    [provider]  diazepam (VALIUM) 5 MG tablet TAKE 1 TABLET(5 MG) BY MOUTH EVERY 12 HOURS AS NEEDED FOR ANXIETY 06/08/23   Myrlene Broker, MD  polyethylene glycol (MIRALAX / GLYCOLAX) 17 g packet Take 17 g by mouth daily.    [provider]  promethazine (PHENERGAN) 25 MG tablet Take 1 tablet (25 mg total) by mouth every 8 (eight) hours as needed for nausea (or migraine). 06/26/21 07/23/23  Gailen Shelter, PA  rosuvastatin (CRESTOR) 20 MG tablet Take 1 tablet (20 mg total) by mouth daily. 06/01/23   Myrlene Broker, MD      Allergies    Codeine and Morphine and codeine    Review of Systems   Review of Systems  All other systems reviewed and are  negative.   Physical Exam Updated Vital Signs BP 138/83   Pulse 88   Temp 97.8 F (36.6 C) (Oral)   Resp 16   Wt 77.1 kg   SpO2 98%   BMI 32.12 kg/m  Physical Exam Vitals and nursing note reviewed.  Constitutional:      Appearance: She is well-developed.  HENT:     Head: Normocephalic and atraumatic.  Cardiovascular:     Rate and Rhythm: Normal rate and regular rhythm.  Pulmonary:     Effort: Pulmonary effort is normal. No respiratory distress.  Musculoskeletal:     Comments: 2+ radial and DP pulses bilaterally.  She is able to fully flex and extend the knees without difficulty.  She does have mild soft tissue tenderness to the knees.  There is soft tissue swelling and tenderness over the left olecranon.  She has pain and inability to fully extend or pronate or supinate the elbow.  She can fully range her wrist, hand, shoulder.  Skin:    General: Skin is warm and dry.  Neurological:     Mental Status: She is alert and oriented to person, place, and time.  Psychiatric:        Behavior: Behavior normal.     ED Results / Procedures / Treatments   Labs (all labs ordered  are listed, but only abnormal results are displayed) Labs Reviewed - No data to display  EKG None  Radiology DG Elbow Complete Left Result Date: 08/27/2023 CLINICAL DATA:  Fall, elbow pain EXAM: LEFT ELBOW - COMPLETE 3+ VIEW COMPARISON:  None Available. FINDINGS: There is a left elbow joint effusion. No visible fracture, subluxation or dislocation. Soft tissues are intact. IMPRESSION: Left elbow joint effusion without visible fracture. Findings concerning for possible occult fracture. Consider immobilization and repeat imaging in 1 week if symptoms persist. Electronically Signed   By: Charlett Nose M.D.   On: 08/27/2023 22:46    Procedures Procedures    Medications Ordered in ED Medications - No data to display  ED Course/ Medical Decision Making/ A&P                                 Medical  Decision Making Amount and/or Complexity of Data Reviewed Radiology: ordered.  Risk Prescription drug management.   Patient here for evaluation of injuries following a mechanical fall.  She does have elbow pain.  Plain films do not identify a definite fracture but she does have a joint effusion.  Given her symptoms concern for occult radial head fracture.  Will place in splint with sling with close outpatient orthopedics follow-up and return precautions.  Clinically there is no evidence of serious fracture to her knees.  She is able to range them without difficulty.        Final Clinical Impression(s) / ED Diagnoses Final diagnoses:  Contusion of left elbow, initial encounter    Rx / DC Orders ED Discharge Orders          Ordered    ondansetron (ZOFRAN-ODT) 4 MG disintegrating tablet  Every 8 hours PRN        08/28/23 0051              Tilden Fossa, MD 08/28/23 [phone]

## 2023-08-31 DIAGNOSIS — M25522 Pain in left elbow: Secondary | ICD-10-CM | POA: Diagnosis not present

## 2023-09-17 DIAGNOSIS — M25561 Pain in right knee: Secondary | ICD-10-CM | POA: Diagnosis not present

## 2023-09-17 DIAGNOSIS — M25562 Pain in left knee: Secondary | ICD-10-CM | POA: Diagnosis not present

## 2023-09-18 ENCOUNTER — Other Ambulatory Visit: Payer: Self-pay | Admitting: Internal Medicine

## 2023-09-23 ENCOUNTER — Ambulatory Visit: Payer: PPO | Admitting: Family Medicine

## 2023-09-24 DIAGNOSIS — M170 Bilateral primary osteoarthritis of knee: Secondary | ICD-10-CM | POA: Diagnosis not present

## 2023-10-01 DIAGNOSIS — S52125A Nondisplaced fracture of head of left radius, initial encounter for closed fracture: Secondary | ICD-10-CM | POA: Diagnosis not present

## 2023-10-12 HISTORY — PX: FOOT SURGERY: SHX648

## 2023-10-13 DIAGNOSIS — S92514A Nondisplaced fracture of proximal phalanx of right lesser toe(s), initial encounter for closed fracture: Secondary | ICD-10-CM | POA: Diagnosis not present

## 2023-10-13 DIAGNOSIS — G5761 Lesion of plantar nerve, right lower limb: Secondary | ICD-10-CM | POA: Diagnosis not present

## 2023-10-19 DIAGNOSIS — G5761 Lesion of plantar nerve, right lower limb: Secondary | ICD-10-CM | POA: Diagnosis not present

## 2023-10-23 ENCOUNTER — Ambulatory Visit: Payer: PPO | Admitting: Family Medicine

## 2023-11-02 ENCOUNTER — Encounter: Payer: Self-pay | Admitting: Family Medicine

## 2023-11-02 ENCOUNTER — Ambulatory Visit (INDEPENDENT_AMBULATORY_CARE_PROVIDER_SITE_OTHER): Payer: PPO | Admitting: Family Medicine

## 2023-11-02 VITALS — BP 126/68 | HR 75 | Temp 97.7°F | Ht 61.0 in | Wt 176.2 lb

## 2023-11-02 DIAGNOSIS — Z7184 Encounter for health counseling related to travel: Secondary | ICD-10-CM | POA: Diagnosis not present

## 2023-11-02 DIAGNOSIS — E782 Mixed hyperlipidemia: Secondary | ICD-10-CM | POA: Diagnosis not present

## 2023-11-02 DIAGNOSIS — F4310 Post-traumatic stress disorder, unspecified: Secondary | ICD-10-CM | POA: Diagnosis not present

## 2023-11-02 DIAGNOSIS — I10 Essential (primary) hypertension: Secondary | ICD-10-CM | POA: Diagnosis not present

## 2023-11-02 DIAGNOSIS — R110 Nausea: Secondary | ICD-10-CM | POA: Insufficient documentation

## 2023-11-02 MED ORDER — ROSUVASTATIN CALCIUM 20 MG PO TABS: 20.0000 mg | ORAL_TABLET | Freq: Every day | ORAL | 3 refills | Status: AC

## 2023-11-02 MED ORDER — PROMETHAZINE HCL 25 MG PO TABS: 25.0000 mg | ORAL_TABLET | Freq: Three times a day (TID) | ORAL | 1 refills | Status: AC | PRN

## 2023-11-02 MED ORDER — AMLODIPINE BESYLATE 10 MG PO TABS: ORAL_TABLET | ORAL | 3 refills | Status: AC

## 2023-11-02 MED ORDER — DIAZEPAM 5 MG PO TABS: 5.0000 mg | ORAL_TABLET | Freq: Four times a day (QID) | ORAL | 5 refills | Status: DC | PRN

## 2023-11-02 NOTE — Assessment & Plan Note (Signed)
 Stable with diazepam 5 mg 1-2 times daily.

## 2023-11-02 NOTE — Assessment & Plan Note (Signed)
 Blood pressure is in good control. Continue amlodipine 10 mg daily.
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
## 2023-11-02 NOTE — Assessment & Plan Note (Signed)
Lipids are at goal. Continue rosuvastatin 20 mg daily.

## 2023-11-02 NOTE — Assessment & Plan Note (Signed)
Uses intermittent promethazine when traveling.

## 2023-12-01 ENCOUNTER — Other Ambulatory Visit: Payer: Self-pay | Admitting: Family Medicine

## 2023-12-01 DIAGNOSIS — Z7184 Encounter for health counseling related to travel: Secondary | ICD-10-CM

## 2023-12-01 MED ORDER — ATOVAQUONE-PROGUANIL HCL 62.5-25 MG PO TABS: 1.0000 | ORAL_TABLET | Freq: Every day | ORAL | 0 refills | Status: DC

## 2023-12-01 MED ORDER — AZITHROMYCIN 500 MG PO TABS: 500.0000 mg | ORAL_TABLET | Freq: Every day | ORAL | 0 refills | Status: DC

## 2023-12-01 NOTE — Progress Notes (Signed)
 Angela Graves will be traveling to [place] for a cruise on the [place]. She will be visiting [address], [address] [address], [address], [address] [place], [address], [address], [place], and [place]. These are all lower elevation locales. I saw her husband today regarding travel medicine recommendations.  I will prescribe Malarone to start 2 days prior to his trip and to continue for 7 days afterwards. I will provide a prescription for azithromycin should traveler's diarrhea occur.   Loyola Mast, MD

## 2024-01-06 ENCOUNTER — Encounter: Payer: Self-pay | Admitting: Family Medicine

## 2024-01-06 DIAGNOSIS — F4310 Post-traumatic stress disorder, unspecified: Secondary | ICD-10-CM

## 2024-01-06 MED ORDER — DIAZEPAM 5 MG PO TABS: 5.0000 mg | ORAL_TABLET | Freq: Two times a day (BID) | ORAL | 5 refills | Status: DC | PRN

## 2024-01-26 DIAGNOSIS — G5761 Lesion of plantar nerve, right lower limb: Secondary | ICD-10-CM | POA: Diagnosis not present

## 2024-01-26 DIAGNOSIS — S92101A Unspecified fracture of right talus, initial encounter for closed fracture: Secondary | ICD-10-CM | POA: Diagnosis not present

## 2024-03-15 DIAGNOSIS — S92101A Unspecified fracture of right talus, initial encounter for closed fracture: Secondary | ICD-10-CM | POA: Diagnosis not present

## 2024-03-15 DIAGNOSIS — G5761 Lesion of plantar nerve, right lower limb: Secondary | ICD-10-CM | POA: Diagnosis not present

## 2024-04-13 ENCOUNTER — Other Ambulatory Visit: Payer: PPO

## 2024-04-13 ENCOUNTER — Ambulatory Visit (INDEPENDENT_AMBULATORY_CARE_PROVIDER_SITE_OTHER): Admitting: Nurse Practitioner

## 2024-04-13 ENCOUNTER — Encounter: Payer: Self-pay | Admitting: Nurse Practitioner

## 2024-04-13 VITALS — BP 128/76 | HR 84 | Temp 98.4°F | Ht 61.0 in | Wt 175.8 lb

## 2024-04-13 DIAGNOSIS — H6690 Otitis media, unspecified, unspecified ear: Secondary | ICD-10-CM | POA: Diagnosis not present

## 2024-04-13 DIAGNOSIS — U071 COVID-19: Secondary | ICD-10-CM

## 2024-04-13 MED ORDER — AZITHROMYCIN 250 MG PO TABS: ORAL_TABLET | ORAL | 0 refills | Status: AC

## 2024-04-13 MED ORDER — PROMETHAZINE-DM 6.25-15 MG/5ML PO SYRP
5.0000 mL | ORAL_SOLUTION | Freq: Four times a day (QID) | ORAL | 0 refills | Status: DC | PRN
Start: 2024-04-13 — End: 2024-05-02

## 2024-04-13 NOTE — Patient Instructions (Addendum)
 It was great to see you!  Start zpak 2 tablets today then 1 tablet daily until gone  Start cough syrup every 4 hours as needed  You can use flonase  as needed for ear congestion  Drink plenty of fluids  Let's follow-up if your symptoms worsen or don't improve  Take care,  Tinnie Harada, NP

## 2024-04-13 NOTE — Progress Notes (Unsigned)
 Acute Office Visit  Subjective:     Patient ID: Angela Graves, female    DOB: March 28, 1956, 68 y.o.   MRN: [id number]  Chief Complaint  Patient presents with   Covid Positive    Coughing and pressure in ears, sinus drainage    HPI  Discussed the use of AI scribe software for clinical note transcription with the patient, who gave verbal consent to proceed.  History of Present Illness   Angela Graves is a 68 year old female who presents with persistent COVID-19 symptoms.  Symptoms began on March 25, 2024, during a cruise, initially testing negative but later positive for COVID-19. Symptoms have persisted for three weeks.  She experiences a persistent cough with yellow-green sputum, nasal congestion, sore throat, and ear discomfort, with ears 'crackling' and pain during flight landing. She has lost taste and smell.  She has fever, muscle aches, burning eyes, and feels feverish despite a low body temperature. She uses aspirin, Benadryl, and occasionally Aleve for symptom management.  She describes shallow breathing and significant fatigue, with a two-mile walk on the last day of the cruise being exhausting, requiring wheelchair assistance at the airport.      ROS See pertinent positives and negatives per HPI.    Objective:    BP 128/76 (BP Location: Left Arm, Patient Position: Sitting, Cuff Size: Normal)   Pulse 84   Temp 98.4 F (36.9 C)   Ht 5' 1 (1.549 m)   Wt 175 lb 12.8 oz (79.7 kg)   SpO2 96%   BMI 33.22 kg/m    Physical Exam Vitals and nursing note reviewed.  Constitutional:      General: She is not in acute distress.    Appearance: Normal appearance.  HENT:     Head: Normocephalic.     Right Ear: Ear canal and external ear normal. Tympanic membrane is erythematous.     Left Ear: Ear canal and external ear normal. Tympanic membrane is erythematous.     Nose:     Right Sinus: Maxillary sinus tenderness and frontal sinus tenderness present.     Left Sinus:  Maxillary sinus tenderness and frontal sinus tenderness present.  Eyes:     Conjunctiva/sclera: Conjunctivae normal.  Cardiovascular:     Rate and Rhythm: Normal rate and regular rhythm.     Pulses: Normal pulses.     Heart sounds: Normal heart sounds.  Pulmonary:     Effort: Pulmonary effort is normal.     Breath sounds: Normal breath sounds.  Musculoskeletal:     Cervical back: Normal range of motion and neck supple. No tenderness.  Lymphadenopathy:     Cervical: No cervical adenopathy.  Skin:    General: Skin is warm.  Neurological:     General: No focal deficit present.     Mental Status: She is alert and oriented to person, place, and time.  Psychiatric:        Mood and Affect: Mood normal.        Behavior: Behavior normal.        Thought Content: Thought content normal.        Judgment: Judgment normal.       Assessment & Plan:   COVID-19 infection with persistent upper respiratory symptoms   Symptoms have persisted for three weeks, including cough with yellow-green sputum, nasal congestion, intermittent fever, shallow breathing, fatigue, anosmia, and ageusia. Aspirin and Aleve have not improved symptoms. Start azithromycin  (Z-Pak) with 2 tablets on the first day,  then 1 tablet daily. Use promethazine  cough syrup every 4 hours as needed. Increase fluid intake and rest. Exercise caution when using promethazine  with Benadryl due to potential sedation.  Otitis Media She does not tolerate amoxicillin  or doxycycline in the past with nausea. Start zpak 2 tablets today, then 1 tablet daily and Flonase  nasal spray for ear crackling and Zyrtec  or Claritin for nasal congestion. Continue Benadryl if it is effective.    Meds ordered this encounter  Medications   azithromycin  (ZITHROMAX ) 250 MG tablet    Sig: Take 2 tablets on day 1, then 1 tablet daily on days 2 through 5    Dispense:  6 tablet    Refill:  0   promethazine -dextromethorphan (PROMETHAZINE -DM) 6.25-15 MG/5ML syrup     Sig: Take 5 mLs by mouth 4 (four) times daily as needed.    Dispense:  118 mL    Refill:  0    Return if symptoms worsen or fail to improve.  Tinnie DELENA Harada, NP

## 2024-05-02 ENCOUNTER — Ambulatory Visit (INDEPENDENT_AMBULATORY_CARE_PROVIDER_SITE_OTHER): Payer: PPO | Admitting: Family Medicine

## 2024-05-02 VITALS — BP 124/72 | HR 74 | Temp 97.5°F | Ht 61.0 in | Wt 174.4 lb

## 2024-05-02 DIAGNOSIS — F4310 Post-traumatic stress disorder, unspecified: Secondary | ICD-10-CM | POA: Diagnosis not present

## 2024-05-02 DIAGNOSIS — E782 Mixed hyperlipidemia: Secondary | ICD-10-CM

## 2024-05-02 DIAGNOSIS — I10 Essential (primary) hypertension: Secondary | ICD-10-CM | POA: Diagnosis not present

## 2024-05-02 NOTE — Assessment & Plan Note (Signed)
 Blood pressure is in good control. Continue amlodipine 10 mg daily.
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
## 2024-05-02 NOTE — Assessment & Plan Note (Signed)
Lipids are at goal. Continue rosuvastatin 20 mg daily.

## 2024-05-02 NOTE — Assessment & Plan Note (Signed)
 Stable with diazepam 5 mg 1-2 times daily.

## 2024-06-06 ENCOUNTER — Encounter: Payer: PPO | Admitting: Internal Medicine

## 2024-06-27 ENCOUNTER — Ambulatory Visit: Payer: Self-pay | Admitting: Family Medicine

## 2024-06-27 ENCOUNTER — Ambulatory Visit (HOSPITAL_BASED_OUTPATIENT_CLINIC_OR_DEPARTMENT_OTHER)
Admission: RE | Admit: 2024-06-27 | Discharge: 2024-06-27 | Disposition: A | Discharge status: Home / Self Care | Source: Ambulatory Visit | Attending: Family Medicine | Admitting: Family Medicine

## 2024-06-27 ENCOUNTER — Encounter: Payer: Self-pay | Admitting: Family Medicine

## 2024-06-27 DIAGNOSIS — Z780 Asymptomatic menopausal state: Secondary | ICD-10-CM | POA: Insufficient documentation

## 2024-06-27 DIAGNOSIS — M8580 Other specified disorders of bone density and structure, unspecified site: Secondary | ICD-10-CM | POA: Insufficient documentation

## 2024-06-27 DIAGNOSIS — Z13820 Encounter for screening for osteoporosis: Secondary | ICD-10-CM | POA: Insufficient documentation

## 2024-06-27 DIAGNOSIS — M8589 Other specified disorders of bone density and structure, multiple sites: Secondary | ICD-10-CM | POA: Diagnosis not present

## 2024-07-20 ENCOUNTER — Other Ambulatory Visit: Payer: Self-pay | Admitting: Family Medicine

## 2024-07-20 DIAGNOSIS — F4310 Post-traumatic stress disorder, unspecified: Secondary | ICD-10-CM

## 2024-08-24 ENCOUNTER — Ambulatory Visit
Admission: EM | Admit: 2024-08-24 | Discharge: 2024-08-24 | Disposition: A | Discharge status: Home / Self Care | Attending: Family Medicine | Admitting: Family Medicine

## 2024-08-24 ENCOUNTER — Encounter: Payer: Self-pay | Admitting: *Deleted

## 2024-08-24 DIAGNOSIS — S91111A Laceration without foreign body of right great toe without damage to nail, initial encounter: Secondary | ICD-10-CM | POA: Diagnosis not present

## 2024-08-24 NOTE — ED Triage Notes (Signed)
 Pt reports she was barefoot, carrying a tray of martinis, and tripped dropping them. She reports multiple lacerations to both feet. Bandaids in place on bottom of left foot with bleeding controlled. Bandage in place to right big toe with bleeding noted through gauze

## 2024-08-27 NOTE — ED Provider Notes (Signed)
 [facility]   [id number] 08/24/24 Arrival Time: 1945  ASSESSMENT & PLAN:  1. Laceration of right great toe without foreign body present or damage to nail, initial encounter     Procedure: Laceration Repair Verbal consent obtained. Patient provided with risks and alternatives to the procedure. Wound copiously irrigated with NS then cleansed with betadine. Local anesthesia: Lidocaine 1% without epinephrine. Wound carefully explored. No foreign body, tendon injury, or nonviable tissue were noted. Using sterile technique, 3 interrupted 4-0 Prolene sutures were placed to reapproximate the wound. Procedure tolerated well. No complications. Minimal bleeding. Advised to look for and return for any signs of infection such as redness, swelling, discharge, or worsening pain. Return for suture removal in 10 days. Reports she has a post-op shoe at home to wear. WBAT.   Reviewed expectations re: course of current medical issues. Questions answered. Outlined signs and symptoms indicating need for more acute intervention. Patient verbalized understanding. After Visit Summary given.   SUBJECTIVE:  Angela Graves is a 68 y.o. female who presents with a laceration of her distal R toe not involving nail; cut on broken glass PTA. Normal ambulation. Bleeding controlled.  Td UTD: Unsure but does not want one this evening.  Health Maintenance Due  Topic Date Due   Lung Cancer Screening  Never done   Zoster Vaccines- Shingrix (1 of 2) Never done   Influenza Vaccine  04/15/2024   COVID-19 Vaccine (6 - 2025-26 season) 05/16/2024   Mammogram  05/28/2024   Medicare Annual Wellness (AWV)  05/31/2024    OBJECTIVE:  Vitals:   08/24/24 1956  BP: (!) 154/72  Pulse: 93  Resp: 18  Temp: 97.6 F (36.4 C)  TempSrc: Oral  SpO2: 95%     General appearance: alert; no distress R great toe: curved laceration of distal great toe; toenail is intact; size: approx 1.5 cm; clean wound edges, no  foreign bodies; without active bleeding; normal distal sensation and cap refill before and after procedure Psychological: alert and cooperative; normal mood and affect    Labs Reviewed - No data to display  No results found.  Allergies[1]  Past Medical History:  Diagnosis Date   Allergy    Anxiety    Arthritis    Constipation    uses Miaralax daily - soft regular BM;s daily on Miralax   Hyperlipidemia    on meds   Hypertension    controlled   Intervertebral disk disease    herniated disk x 3   Migraine    PONV (postoperative nausea and vomiting)    with Morphine   Positive colorectal cancer screening using Cologuard test    Social History   Socioeconomic History   Marital status: Married    Spouse name: Vermell   Number of children: 2   Years of education: Not on file   Highest education level: 12th grade  Occupational History   Occupation: Retired  Tobacco Use   Smoking status: Former    Current packs/day: 1.50    Average packs/day: 1.5 packs/day for 57.9 years (86.9 ttl pk-yrs)    Types: Cigarettes    Start date: 09/15/1966   Smokeless tobacco: Never  Vaping Use   Vaping status: Never Used  Substance and Sexual Activity   Alcohol use: Yes    Alcohol/week: 7.0 standard drinks of alcohol    Types: 5 Glasses of wine, 2 Standard drinks or equivalent per week    Comment: socially   Drug use: No   Sexual activity:  Not Currently    Birth control/protection: Post-menopausal, Surgical    Comment: s/p complete hysterectomy with BSO in her 30s  Other Topics Concern   Not on file  Social History Narrative   Not on file   Social Drivers of Health   Tobacco Use: Medium Risk (04/13/2024)   Patient History    Smoking Tobacco Use: Former    Smokeless Tobacco Use: Never    Passive Exposure: Not on file  Financial Resource Strain: Low Risk (04/28/2024)   Overall Financial Resource Strain (CARDIA)    Difficulty of Paying Living Expenses: Not very hard  Food Insecurity:  No Food Insecurity (04/28/2024)   Epic    Worried About Programme Researcher, Broadcasting/film/video in the Last Year: Never true    Ran Out of Food in the Last Year: Never true  Transportation Needs: No Transportation Needs (04/28/2024)   Epic    Lack of Transportation (Medical): No    Lack of Transportation (Non-Medical): No  Physical Activity: Sufficiently Active (04/28/2024)   Exercise Vital Sign    Days of Exercise per Week: 6 days    Minutes of Exercise per Session: 40 min  Stress: Stress Concern Present (04/28/2024)   [employer] of Occupational Health - Occupational Stress Questionnaire    Feeling of Stress: Rather much  Social Connections: Moderately Isolated (04/28/2024)   Social Connection and Isolation Panel    Frequency of Communication with Friends and Family: More than three times a week    Frequency of Social Gatherings with Friends and Family: Twice a week    Attends Religious Services: Never    Database Administrator or Organizations: No    Attends Engineer, Structural: Not on file    Marital Status: Married  Depression (PHQ2-9): Low Risk (11/02/2023)   Depression (PHQ2-9)    PHQ-2 Score: 0  Alcohol Screen: Low Risk (04/28/2024)   Alcohol Screen    Last Alcohol Screening Score (AUDIT): 4  Housing: Low Risk (04/28/2024)   Epic    Unable to Pay for Housing in the Last Year: No    Number of Times Moved in the Last Year: 0    Homeless in the Last Year: No  Utilities: Not on file  Health Literacy: Not on file           [1]  Allergies Allergen Reactions   Codeine Itching   Morphine And Codeine Nausea And Vomiting    Hyperactive, N/V      Rolinda Rogue, MD 08/27/24 9179

## 2024-09-02 ENCOUNTER — Ambulatory Visit: Admitting: Family Medicine

## 2024-09-02 ENCOUNTER — Ambulatory Visit: Admission: EM | Admit: 2024-09-02 | Discharge: 2024-09-02 | Disposition: A | Discharge status: Home / Self Care

## 2024-09-02 ENCOUNTER — Encounter: Payer: Self-pay | Admitting: *Deleted

## 2024-09-02 DIAGNOSIS — Z5189 Encounter for other specified aftercare: Secondary | ICD-10-CM | POA: Diagnosis not present

## 2024-09-02 NOTE — Discharge Instructions (Signed)
 You were seen at the urgent care today for an evaluation of the wound.  The right great toe appears to be healing well but does not appear to be fully healed for the sutures to be removed.  I would recommend following up with your primary care provider with your appointment scheduled for next week for evaluation for removal at that time.  For any concerns of infection such as purulent drainage, significant pain, redness, or fevers, please seek medical evaluation.  In the meantime, keep the wound clean and dry and frequently change dressings.

## 2024-09-02 NOTE — ED Triage Notes (Addendum)
 Pt here for removal of stitches from right big toe placed on 08/24/24. States still bleeding at times. 3 stitches noted. Incision slightly open. Pt states toe feels a little tender.

## 2024-09-02 NOTE — ED Provider Notes (Signed)
 " [facility]    CSN: [id number] Arrival date & time: 09/02/24  1820      History   Chief Complaint Chief Complaint  Patient presents with   Suture / Staple Removal    HPI Angela Graves is a 68 y.o. female.  Patient presents to urgent care today for evaluation of sutures and wound.  Reportedly had sutures placed about 9 days ago on the right great toe following a laceration with glass.  Reports continued bloody discharge and drainage but denies any purulent drainage.  No reported significant or severe pain in this area.  Denies any wound dehiscence.   Suture / Staple Removal    Past Medical History:  Diagnosis Date   Allergy    Anxiety    Arthritis    Constipation    uses Miaralax daily - soft regular BM;s daily on Miralax   Hyperlipidemia    on meds   Hypertension    controlled   Intervertebral disk disease    herniated disk x 3   Migraine    PONV (postoperative nausea and vomiting)    with Morphine   Positive colorectal cancer screening using Cologuard test     Patient Active Problem List   Diagnosis Date Noted   Osteopenia 06/27/2024   Nausea 11/02/2023   Rheumatoid factor positive 07/27/2023   PTSD (post-traumatic stress disorder) 07/23/2023   Panic disorder 07/23/2023   Seasonal allergic rhinitis 07/23/2023   Degenerative arthritis of knee, bilateral 07/23/2023   Metatarsalgia of right foot 07/23/2023   Polyp of cecum    Adenomatous polyp of transverse colon    Adenomatous polyp of descending colon    Sacral back pain 07/06/2021   Migraine 03/21/2019   Essential hypertension 04/29/2017   Hyperlipidemia 04/29/2017   Chronic constipation 04/29/2017   Fatty liver 04/29/2017    Past Surgical History:  Procedure Laterality Date   ABDOMINAL HYSTERECTOMY Bilateral 1979   complete with BSO due to endometriosis and ovarian cysts   APPENDECTOMY     BIOPSY  01/07/2022   Procedure: BIOPSY;  Surgeon: Shila Gustav GAILS, MD;  Location: [facility]  END[facility];  Service: Gastroenterology;;   CHOLECYSTECTOMY     COLONOSCOPY WITH PROPOFOL  N/A 01/07/2022   Procedure: COLONOSCOPY WITH PROPOFOL ;  Surgeon: Shila Gustav GAILS, MD;  Location: [facility];  Service: Gastroenterology;  Laterality: N/A;   FOOT SURGERY Right 10/12/2023   HEMOSTASIS CLIP PLACEMENT  01/07/2022   Procedure: HEMOSTASIS CLIP PLACEMENT;  Surgeon: Shila Gustav GAILS, MD;  Location: [facility];  Service: Gastroenterology;;   KNEE CARTILAGE SURGERY Bilateral    POLYPECTOMY  01/07/2022   Procedure: POLYPECTOMY;  Surgeon: Shila Gustav GAILS, MD;  Location: [facility];  Service: Gastroenterology;;   SUBMUCOSAL LIFTING INJECTION  01/07/2022   Procedure: SUBMUCOSAL LIFTING INJECTION;  Surgeon: Shila Gustav GAILS, MD;  Location: [facility];  Service: Gastroenterology;;    OB History   No obstetric history on file.      Home Medications    Prior to Admission medications  Medication Sig Start Date End Date Taking? Authorizing Provider  amLODipine  (NORVASC ) 10 MG tablet TAKE 1 TABLET(10 MG) BY MOUTH DAILY 11/02/23   Thedora Garnette HERO, MD  diazepam  (VALIUM ) 5 MG tablet TAKE 1 TABLET(5 MG) BY MOUTH EVERY 12 HOURS AS NEEDED FOR ANXIETY 07/20/24   Thedora Garnette HERO, MD  polyethylene glycol (MIRALAX / GLYCOLAX) 17 g packet Take 17 g by mouth daily.    [provider]  promethazine  (PHENERGAN ) 25 MG tablet  Take 1 tablet (25 mg total) by mouth every 8 (eight) hours as needed for nausea (or migraine). 11/02/23 09/03/24  Thedora Garnette HERO, MD  rosuvastatin  (CRESTOR ) 20 MG tablet Take 1 tablet (20 mg total) by mouth daily. 11/02/23   Thedora Garnette HERO, MD    Family History Family History  Adopted: Yes  Family history unknown: Yes    Social History Social History[1]   Allergies   Codeine and Morphine and codeine   Review of Systems Review of Systems  Skin:  Positive for wound.  All other systems reviewed and are negative.    Physical Exam Triage Vital  Signs ED Triage Vitals  Encounter Vitals Group     BP 09/02/24 1856 139/78     Girls Systolic BP Percentile --      Girls Diastolic BP Percentile --      Boys Systolic BP Percentile --      Boys Diastolic BP Percentile --      Pulse Rate 09/02/24 1856 81     Resp 09/02/24 1856 17     Temp 09/02/24 1856 (!) 97.5 F (36.4 C)     Temp src --      SpO2 09/02/24 1856 92 %     Weight --      Height --      Head Circumference --      Peak Flow --      Pain Score 09/02/24 1908 1     Pain Loc --      Pain Education --      Exclude from Growth Chart --    No data found.  Updated Vital Signs BP 139/78 (BP Location: Left Arm)   Pulse 81   Temp (!) 97.5 F (36.4 C)   Resp 17   SpO2 92%   Visual Acuity Right Eye Distance:   Left Eye Distance:   Bilateral Distance:    Right Eye Near:   Left Eye Near:    Bilateral Near:     Physical Exam Vitals and nursing note reviewed.  Constitutional:      General: She is not in acute distress.    Appearance: Normal appearance. She is not ill-appearing.  Skin:    Findings: Lesion present.     Comments: 1.5 crescent shaped lesion to the sole of the right great toe well healing but does not appear healed enough for suture removal. 3 prolene sutures in place.  Neurological:     Mental Status: She is alert.      UC Treatments / Results  Labs (all labs ordered are listed, but only abnormal results are displayed) Labs Reviewed - No data to display  EKG   Radiology No results found.  Procedures Procedures (including critical care time)  Medications Ordered in UC Medications - No data to display  Initial Impression / Assessment and Plan / UC Course  I have reviewed the triage vital signs and the nursing notes.  Pertinent labs & imaging results that were available during my care of the patient were reviewed by me and considered in my medical decision making (see chart for details).     Patient presents to the urgent care  today for concerns of suture removal.  Reports to  having sutures placed on 08/24/2024.  States that she is here for evaluation of possible removal.  She is advised to come in in about 10 days for possible removal.  She denies any concern for infection and states that the area  continues to drain a slight bloody discharge.  Denies severe pain, redness, or fevers.  Evaluation of the wound reveals well-healed wound but still requiring more time.  There is still evidence of wound closure occurring as deep tissues are still seen.  No concern for infection at this time.  Advise reevaluation by PCP in about a week at her current scheduled appointment for removal at that time if well-healed.  Will hold off on starting antibiotics at this time.  She is otherwise stable for outpatient follow-up and discharged home. Final Clinical Impressions(s) / UC Diagnoses   Final diagnoses:  Visit for wound check     Discharge Instructions      You were seen at the urgent care today for an evaluation of the wound.  The right great toe appears to be healing well but does not appear to be fully healed for the sutures to be removed.  I would recommend following up with your primary care provider with your appointment scheduled for next week for evaluation for removal at that time.  For any concerns of infection such as purulent drainage, significant pain, redness, or fevers, please seek medical evaluation.  In the meantime, keep the wound clean and dry and frequently change dressings.     ED Prescriptions   None    PDMP not reviewed this encounter.    [1]  Social History Tobacco Use   Smoking status: Former    Current packs/day: 1.50    Average packs/day: 1.5 packs/day for 58.0 years (86.9 ttl pk-yrs)    Types: Cigarettes    Start date: 09/15/1966   Smokeless tobacco: Never  Vaping Use   Vaping status: Never Used  Substance Use Topics   Alcohol use: Yes    Alcohol/week: 7.0 standard drinks of alcohol     Types: 5 Glasses of wine, 2 Standard drinks or equivalent per week    Comment: socially   Drug use: No     Jenean Escandon A, PA-C 09/02/24 1917  "

## 2024-09-09 ENCOUNTER — Ambulatory Visit: Admitting: Family Medicine

## 2024-09-09 ENCOUNTER — Encounter: Payer: Self-pay | Admitting: Family Medicine

## 2024-09-09 VITALS — BP 128/70 | HR 78 | Temp 97.7°F | Ht 61.0 in | Wt 171.6 lb

## 2024-09-09 DIAGNOSIS — S91112S Laceration without foreign body of left great toe without damage to nail, sequela: Secondary | ICD-10-CM

## 2024-09-09 MED ORDER — DOXYCYCLINE HYCLATE 100 MG PO TABS: 100.0000 mg | ORAL_TABLET | Freq: Two times a day (BID) | ORAL | 0 refills | Status: AC

## 2024-09-10 ENCOUNTER — Encounter: Payer: Self-pay | Admitting: Family Medicine

## 2024-09-10 NOTE — Progress Notes (Signed)
 "    Diagnoses and Orders:   1. Laceration of left great toe without foreign body present or damage to nail, sequela    Meds ordered this encounter  Medications   doxycycline  (VIBRA -TABS) 100 MG tablet    Sig: Take 1 tablet (100 mg total) by mouth 2 (two) times daily for 7 days.    Dispense:  14 tablet    Refill:  0   No orders of the defined types were placed in this encounter.  Assessment & Plan:   Assessment and Plan Assessment & Plan Laceration of left great toe with sutures Laceration healing with partial suture extrusion, no infection, good granulation. Risk of infection due to environmental exposure and frequent ambulation. - Leave sutures in place for a few more days. - Apply mupirocin ointment to wound. - Prescribed doxycycline  for 7 days, caution for photosensitivity. - Use compression with rubber tubing or Coban to decrease edema and help with wound approximation.  - Elevate feet when not moving. - Follow-up mid-next week for reassessment and potential suture removal.  Geni Shutter, DO, MS, FAAFP, Dipl. [facility] [address] [place], [address] Dept: [phone] Dept Fax: [phone]  Subjective:   History of Present Illness Angela Graves is a 68 year old female who presents with concerns about the healing of a laceration on her toe.  Toe laceration and wound healing - Sustained a laceration to the toe after tripping over a dog's water dish while carrying a tray of glasses - Three sutures placed at urgent care - No drainage from the wound - Minimal pain at the site - Area kept covered with tight Band-Aids and foam tubing - Over-the-counter antibiotic cream applied regularly - No oral antibiotics prescribed; requesting antibiotics due to wound exposure to dogs and the floor - Concerned about white tissue around the wound, suspecting poor blood flow  Infection risk and wound care concerns - Worried  about infection due to environmental exposure (dogs, floor) - No current signs of infection such as erythema, purulent drainage, or fever  Activity level and swelling - On feet most of the day with difficulty limiting activity - Concerned that high activity may worsen swelling - Remains very active at home with little sitting  Neuropathy and foot history - History of prior foot surgery - Neuropathy affecting three toes, impacting balance  Compression and edema management - Declines compression socks due to discomfort - Uses loose airline socks at night to reduce irritation  Review of Systems: Negative, with the exception of above mentioned in HPI.  History:   Reviewed by clinician on day of visit: allergies, medications, problem list, medical history, surgical history, family history, social history, and previous encounter notes.    Medications:   Show/hide medication list[1] Allergies[2]  Objective:   BP 128/70   Pulse 78   Temp 97.7 F (36.5 C) (Temporal)   Ht 5' 1 (1.549 m)   Wt 171 lb 9.6 oz (77.8 kg)   SpO2 95%   BMI 32.42 kg/m    Physical Exam Vitals reviewed.            Results for orders placed or performed in visit on 07/23/23  C-reactive protein   Collection Time: 07/23/23 11:14 AM  Result Value Ref Range   CRP <1.0 0.5 - 20.0 mg/dL  Rheumatoid factor   Collection Time: 07/23/23 11:14 AM  Result Value Ref Range   Rheumatoid fact SerPl-aCnc 69 (H) <14 IU/mL  Sedimentation rate   Collection Time: 07/23/23 11:14 AM  Result Value Ref Range   Sed Rate 7 0 - 30 mm/hr  Cyclic citrul peptide antibody, IgG   Collection Time: 07/23/23 11:14 AM  Result Value Ref Range   Cyclic Citrullin Peptide Ab <83 UNITS       [1]  Outpatient Medications Prior to Visit  Medication Sig   amLODipine  (NORVASC ) 10 MG tablet TAKE 1 TABLET(10 MG) BY MOUTH DAILY   diazepam  (VALIUM ) 5 MG tablet TAKE 1 TABLET(5 MG) BY MOUTH EVERY 12 HOURS AS NEEDED FOR ANXIETY    polyethylene glycol (MIRALAX / GLYCOLAX) 17 g packet Take 17 g by mouth daily.   promethazine  (PHENERGAN ) 25 MG tablet Take 1 tablet (25 mg total) by mouth every 8 (eight) hours as needed for nausea (or migraine).   rosuvastatin  (CRESTOR ) 20 MG tablet Take 1 tablet (20 mg total) by mouth daily.   No facility-administered medications prior to visit.  [2]  Allergies Allergen Reactions   Codeine Itching   Morphine And Codeine Nausea And Vomiting    Hyperactive, N/V    "

## 2024-09-13 ENCOUNTER — Ambulatory Visit: Admitting: Family Medicine

## 2024-09-14 ENCOUNTER — Ambulatory Visit: Admitting: Family Medicine

## 2024-09-14 ENCOUNTER — Encounter: Payer: Self-pay | Admitting: Family Medicine

## 2024-09-14 VITALS — BP 124/70 | HR 72 | Temp 97.3°F | Ht 61.0 in | Wt 172.6 lb

## 2024-09-14 DIAGNOSIS — S91111D Laceration without foreign body of right great toe without damage to nail, subsequent encounter: Secondary | ICD-10-CM | POA: Diagnosis not present

## 2024-09-14 DIAGNOSIS — Z1231 Encounter for screening mammogram for malignant neoplasm of breast: Secondary | ICD-10-CM

## 2024-09-14 DIAGNOSIS — I10 Essential (primary) hypertension: Secondary | ICD-10-CM

## 2024-09-14 DIAGNOSIS — Z4802 Encounter for removal of sutures: Secondary | ICD-10-CM | POA: Diagnosis not present

## 2024-09-14 NOTE — Progress Notes (Signed)
 " [facility] [address] [place] [address] Dept: [phone] Dept Fax: [phone]  Office Visit  Subjective:    Patient ID: Angela Graves, female    DOB: 1956/02/04, 68 y.o..   MRN: [id number]  Chief Complaint  Patient presents with   Suture / Staple Removal    Remove stitches from RT big toe.     History of Present Illness:  Patient is in today for a suture removal on right great toe. She was seen initially at an urgent care for a laceration due to broken glass on 08/24/2024. She received 3 interrupted 4-0 Prolene sutures with additional instructions for care and return. She feels like this has been healing well, though she questions whether enough sutures were placed.  Angela Graves has a history of hypertension. She is managed on amlodipine  10 mg daily.   Past Medical History: Patient Active Problem List   Diagnosis Date Noted   Osteopenia 06/27/2024   Nausea 11/02/2023   Rheumatoid factor positive 07/27/2023   PTSD (post-traumatic stress disorder) 07/23/2023   Panic disorder 07/23/2023   Seasonal allergic rhinitis 07/23/2023   Degenerative arthritis of knee, bilateral 07/23/2023   Metatarsalgia of right foot 07/23/2023   Polyp of cecum    Adenomatous polyp of transverse colon    Adenomatous polyp of descending colon    Sacral back pain 07/06/2021   Migraine 03/21/2019   Essential hypertension 04/29/2017   Hyperlipidemia 04/29/2017   Chronic constipation 04/29/2017   Fatty liver 04/29/2017   Past Surgical History:  Procedure Laterality Date   ABDOMINAL HYSTERECTOMY Bilateral 1979   complete with BSO due to endometriosis and ovarian cysts   APPENDECTOMY     BIOPSY  01/07/2022   Procedure: BIOPSY;  Surgeon: Shila Gustav GAILS, MD;  Location: [facility];  Service: Gastroenterology;;   CHOLECYSTECTOMY     COLONOSCOPY WITH PROPOFOL  N/A 01/07/2022   Procedure: COLONOSCOPY WITH PROPOFOL ;  Surgeon:  Shila Gustav GAILS, MD;  Location: [facility];  Service: Gastroenterology;  Laterality: N/A;   FOOT SURGERY Right 10/12/2023   HEMOSTASIS CLIP PLACEMENT  01/07/2022   Procedure: HEMOSTASIS CLIP PLACEMENT;  Surgeon: Shila Gustav GAILS, MD;  Location: [facility];  Service: Gastroenterology;;   JOINT REPLACEMENT     KNEE CARTILAGE SURGERY Bilateral    POLYPECTOMY  01/07/2022   Procedure: POLYPECTOMY;  Surgeon: Shila Gustav GAILS, MD;  Location: [facility];  Service: Gastroenterology;;   SUBMUCOSAL LIFTING INJECTION  01/07/2022   Procedure: SUBMUCOSAL LIFTING INJECTION;  Surgeon: Nandigam, Kavitha V, MD;  Location: [facility];  Service: Gastroenterology;;   Family History  Adopted: Yes  Family history unknown: Yes   Outpatient Medications Prior to Visit  Medication Sig Dispense Refill   amLODipine  (NORVASC ) 10 MG tablet TAKE 1 TABLET(10 MG) BY MOUTH DAILY 90 tablet 3   diazepam  (VALIUM ) 5 MG tablet TAKE 1 TABLET(5 MG) BY MOUTH EVERY 12 HOURS AS NEEDED FOR ANXIETY 60 tablet 2   doxycycline  (VIBRA -TABS) 100 MG tablet Take 1 tablet (100 mg total) by mouth 2 (two) times daily for 7 days. 14 tablet 0   polyethylene glycol (MIRALAX / GLYCOLAX) 17 g packet Take 17 g by mouth daily.     promethazine  (PHENERGAN ) 25 MG tablet Take 1 tablet (25 mg total) by mouth every 8 (eight) hours as needed for nausea (or migraine). 30 tablet 1   rosuvastatin  (CRESTOR ) 20 MG tablet Take 1 tablet (20 mg total) by mouth daily. 90 tablet 3  No facility-administered medications prior to visit.   Allergies[1]   Objective:   Today's Vitals   09/14/24 1100  BP: 124/70  Pulse: 72  Temp: (!) 97.3 F (36.3 C)  TempSrc: Temporal  SpO2: 96%  Weight: 172 lb 9.6 oz (78.3 kg)  Height: 5' 1 (1.549 m)   Body mass index is 32.61 kg/m.   General: Well developed, well nourished. No acute distress. Foot: There is a healing laceration tot he distal tip of the right great toe. The woudn margins are well  adhered and there is no sign of   infection. Sutures are in place. Psych: Alert and oriented. Normal mood and affect.  Health Maintenance Due  Topic Date Due   Lung Cancer Screening  Never done   Zoster Vaccines- Shingrix (1 of 2) Never done   Mammogram  05/28/2024   Medicare Annual Wellness (AWV)  05/31/2024   Colonoscopy  01/07/2025   PROCEDURE- Suture removal Indication: Laceration, right great toe Anesthesia: Local- None  PARQ reviewed with patient. Verbal consent obtained. Removed 3 interrupted sutures. Patient tolerated procedure well. Routine wound care instructions reviewed with patient.     Assessment & Plan:   Problem List Items Addressed This Visit       Cardiovascular and Mediastinum   Essential hypertension   Blood pressure is in good control. Continue amlodipine  10 mg daily.      Other Visit Diagnoses       Laceration of right great toe without foreign body present or damage to nail, subsequent encounter    -  Primary   Healing well. Sutures removed as noted. Recommend routine wound care.     Encounter for screening mammogram for malignant neoplasm of breast       Relevant Orders   MM 3D SCREENING MAMMOGRAM BILATERAL BREAST       Return for Follow-up as scheduled.    Garnette CHRISTELLA Simpler, MD  I,Emily Lagle,acting as a scribe for Garnette CHRISTELLA Simpler, MD.,have documented all relevant documentation on the behalf of Garnette CHRISTELLA Simpler, MD.  I, Garnette CHRISTELLA Simpler, MD, have reviewed all documentation for this visit. The documentation on 09/14/2024 for the exam, diagnosis, procedures, and orders are all accurate and complete.     [1]  Allergies Allergen Reactions   Codeine Itching   Morphine And Codeine Nausea And Vomiting    Hyperactive, N/V    "

## 2024-09-14 NOTE — Assessment & Plan Note (Signed)
 Blood pressure is in good control. Continue amlodipine 10 mg daily.

## 2024-10-31 ENCOUNTER — Encounter

## 2024-11-02 ENCOUNTER — Ambulatory Visit: Admitting: Family Medicine
